# Patient Record
Sex: Male | Born: 1975 | Race: White | Hispanic: No | Marital: Married | Smoking: Never smoker
Health system: Southern US, Community
[De-identification: ages and names within clinical notes are randomized; demographics above are authoritative.]

## PROBLEM LIST (undated history)

## (undated) DIAGNOSIS — T7840XA Allergy, unspecified, initial encounter: Secondary | ICD-10-CM

## (undated) HISTORY — PX: INGUINAL HERNIA REPAIR: SUR1180

## (undated) HISTORY — PX: ADENOIDECTOMY: SUR15

## (undated) HISTORY — DX: Allergy, unspecified, initial encounter: T78.40XA

---

## 1999-01-12 ENCOUNTER — Encounter: Payer: Self-pay | Admitting: Emergency Medicine

## 1999-01-12 ENCOUNTER — Emergency Department (HOSPITAL_COMMUNITY): Admission: EM | Admit: 1999-01-12 | Discharge: 1999-01-12 | Payer: Self-pay | Admitting: Emergency Medicine

## 2006-01-20 ENCOUNTER — Other Ambulatory Visit: Payer: Self-pay

## 2006-01-20 ENCOUNTER — Emergency Department: Payer: Self-pay | Admitting: Emergency Medicine

## 2007-12-24 IMAGING — CR DG CHEST 2V
1 series · 2 of 2 positions shown · non-contrast
Comparison: none

REASON FOR EXAM: Chest pain. Rm 14
COMMENTS:

PROCEDURE:     DXR - DXR CHEST PA (OR AP) AND LATERAL  - January 20, 2006 [DATE]
RESULT:     The lung fields are clear.  No pneumonia, pneumothorax, or
pleural effusion is seen.  The heart, mediastinal, and osseous structures
show no significant abnormalities.

[Series 1: view not recorded · 0.17mm/px · 2 of 2 slices shown]
[im 1/2]
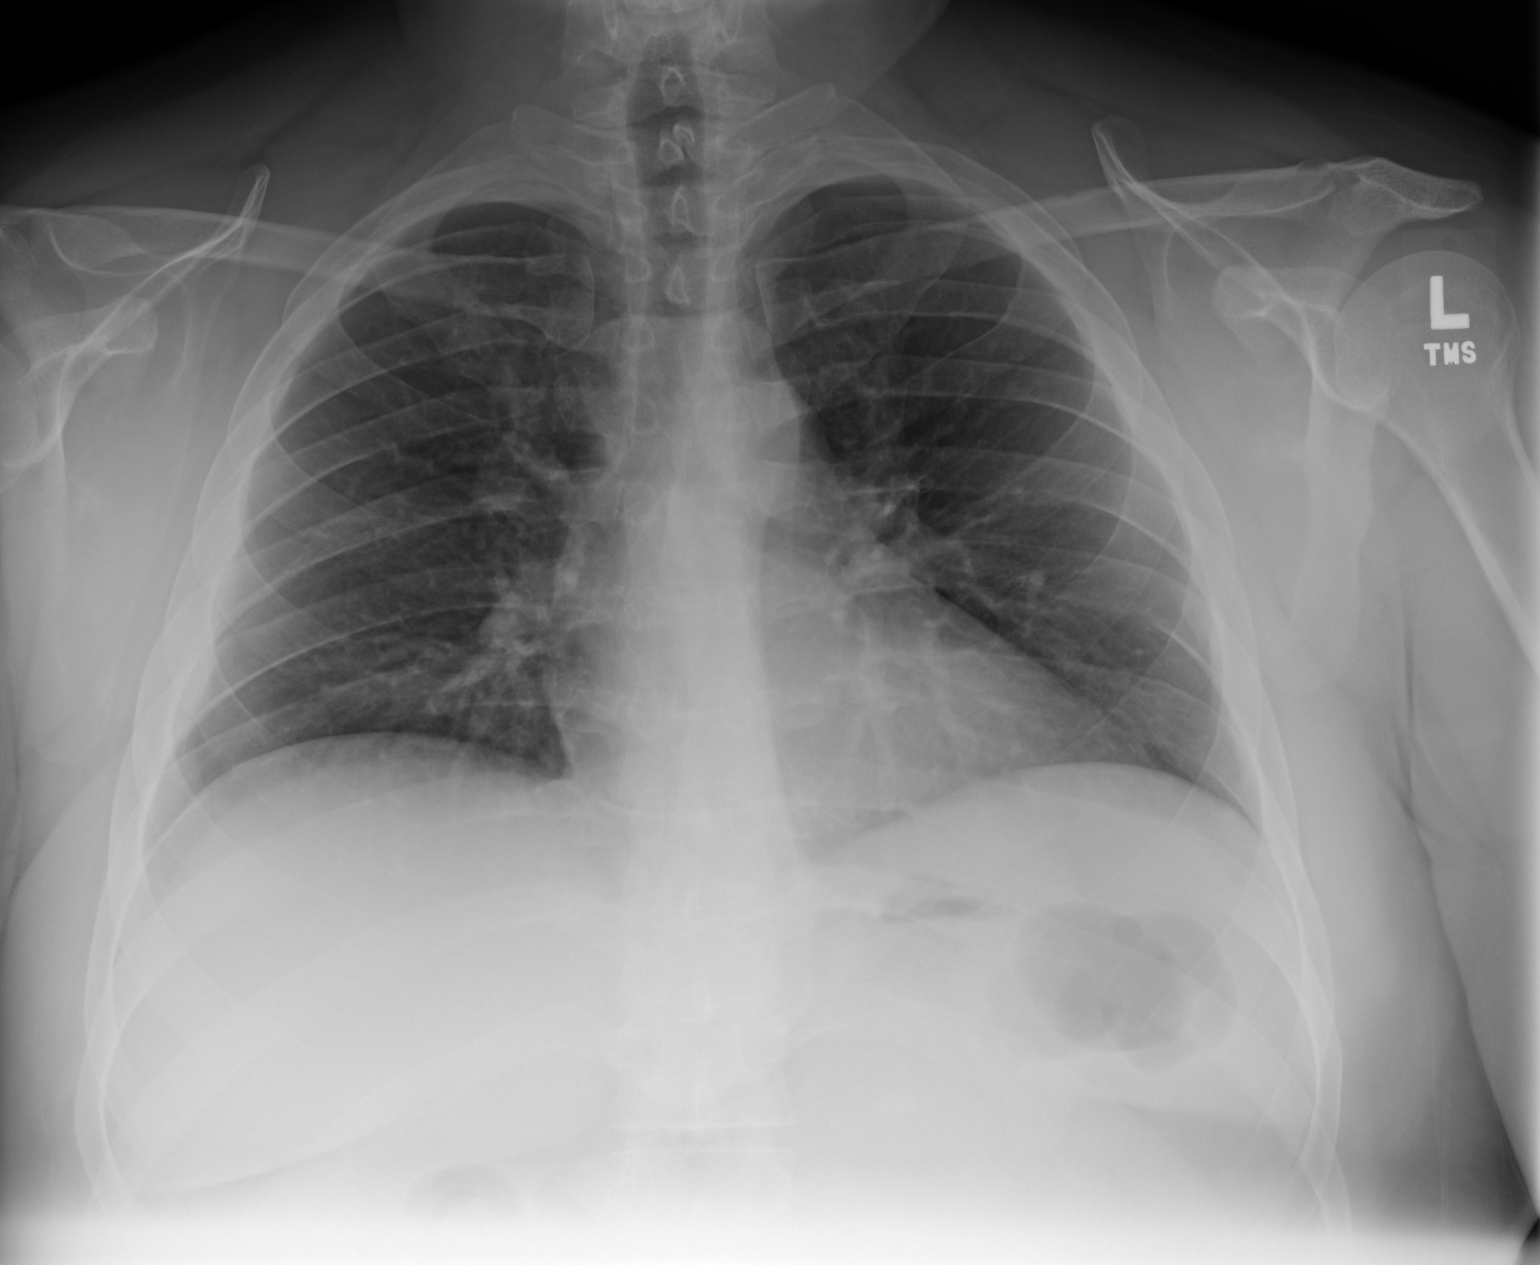
[im 2/2]
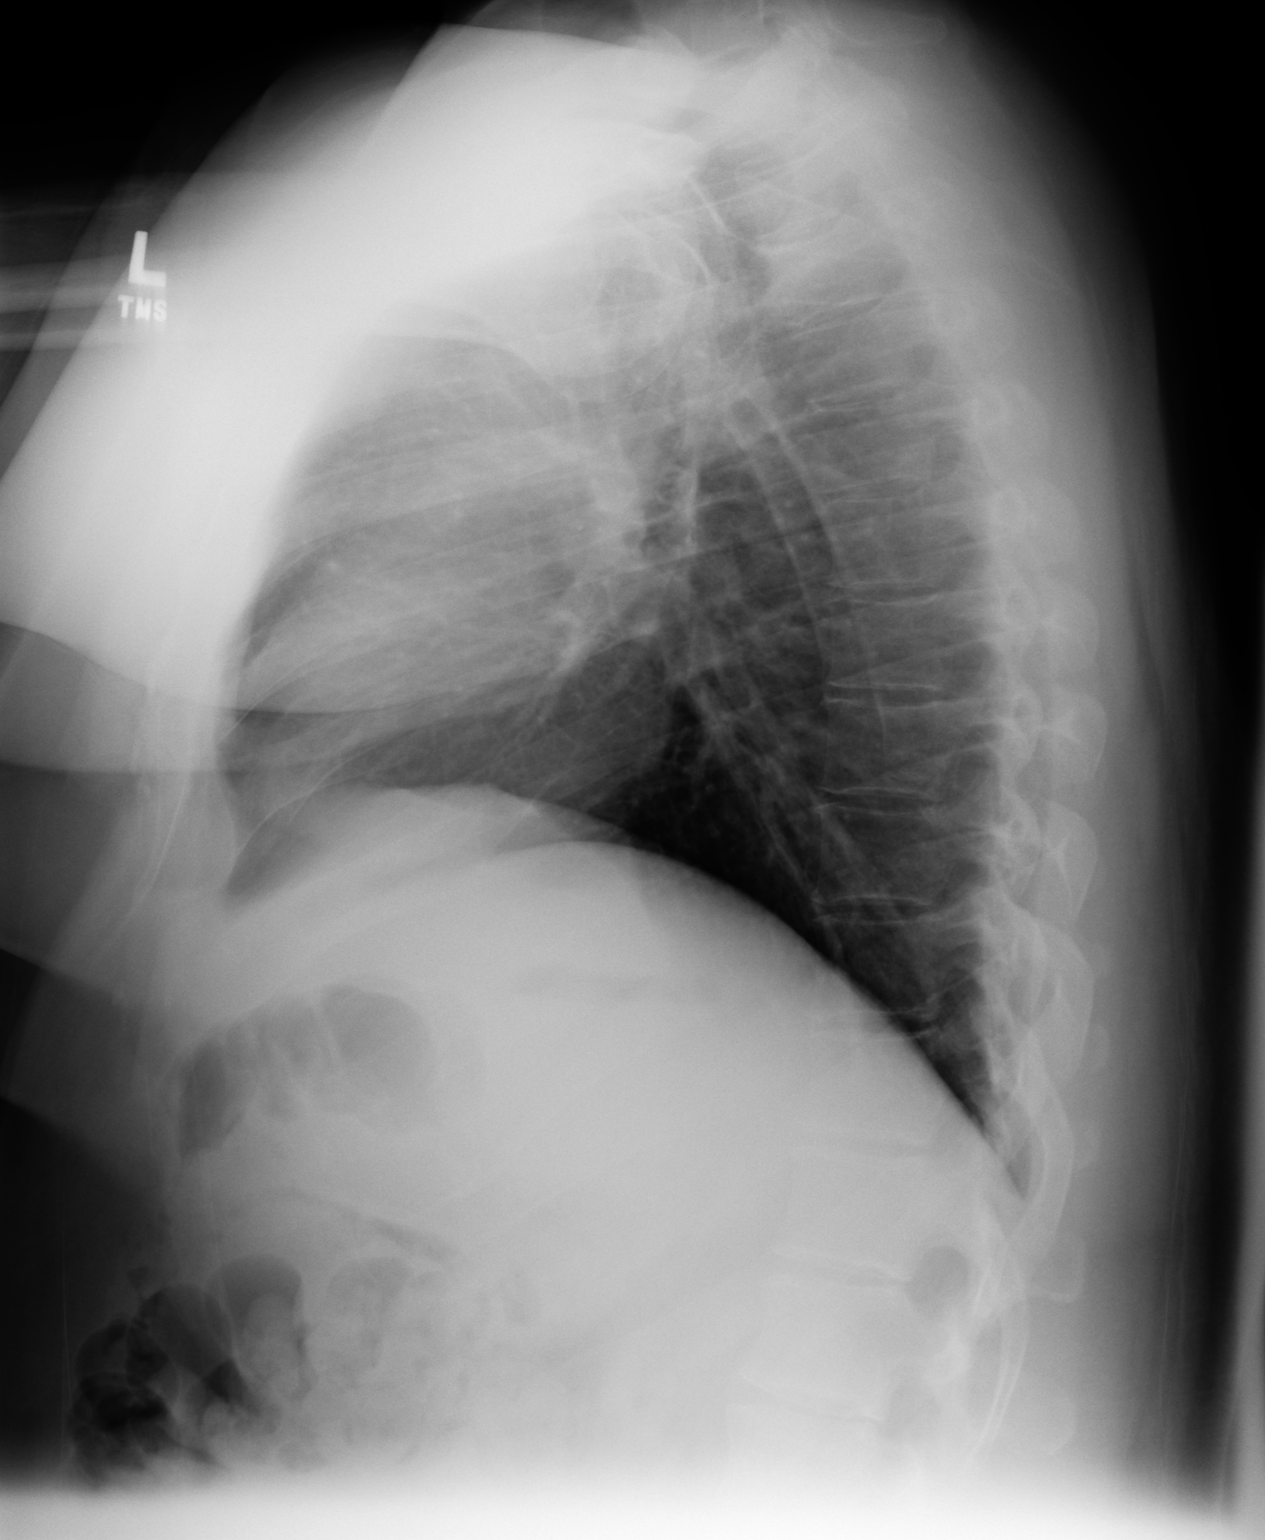

[2 of 2 positions shown; findings below may reference images not displayed]

IMPRESSION: No acute changes are identified.

## 2008-03-05 ENCOUNTER — Ambulatory Visit: Payer: Self-pay | Admitting: Family Medicine

## 2008-03-05 DIAGNOSIS — E663 Overweight: Secondary | ICD-10-CM | POA: Insufficient documentation

## 2008-03-05 DIAGNOSIS — J309 Allergic rhinitis, unspecified: Secondary | ICD-10-CM | POA: Insufficient documentation

## 2008-03-05 DIAGNOSIS — Z6837 Body mass index (BMI) 37.0-37.9, adult: Secondary | ICD-10-CM

## 2008-03-05 DIAGNOSIS — F339 Major depressive disorder, recurrent, unspecified: Secondary | ICD-10-CM | POA: Insufficient documentation

## 2008-03-05 DIAGNOSIS — E661 Drug-induced obesity: Secondary | ICD-10-CM | POA: Insufficient documentation

## 2008-05-05 ENCOUNTER — Ambulatory Visit: Payer: Self-pay | Admitting: Family Medicine

## 2008-05-05 DIAGNOSIS — R071 Chest pain on breathing: Secondary | ICD-10-CM | POA: Insufficient documentation

## 2008-07-28 ENCOUNTER — Ambulatory Visit: Payer: Self-pay | Admitting: Family Medicine

## 2008-07-28 DIAGNOSIS — E119 Type 2 diabetes mellitus without complications: Secondary | ICD-10-CM | POA: Insufficient documentation

## 2008-07-28 DIAGNOSIS — R030 Elevated blood-pressure reading, without diagnosis of hypertension: Secondary | ICD-10-CM | POA: Insufficient documentation

## 2008-09-27 ENCOUNTER — Telehealth: Payer: Self-pay | Admitting: Family Medicine

## 2009-04-04 ENCOUNTER — Telehealth: Payer: Self-pay | Admitting: Family Medicine

## 2009-11-24 ENCOUNTER — Encounter: Payer: Self-pay | Admitting: Family Medicine

## 2010-07-04 NOTE — Miscellaneous (Signed)
  Clinical Lists Changes  Medications: Added new medication of PRILOSEC 20 MG CPDR (OMEPRAZOLE) one tablet daily - Signed Rx of PRILOSEC 20 MG CPDR (OMEPRAZOLE) one tablet daily;  #30 x 11;  Signed;  Entered by: Benny Lennert CMA (AAMA);  Authorized by: Hannah Beat MD;  Method used: Electronically to [facility]  [place], [place], [place]  [address], Ph: [phone] or [phone], Fax: [phone]    Prescriptions: PRILOSEC 20 MG CPDR (OMEPRAZOLE) one tablet daily  #30 x 11   Entered by:   Benny Lennert CMA (AAMA)   Authorized by:   Hannah Beat MD   Signed by:   Benny Lennert CMA (AAMA) on 11/24/2009   Method used:   Electronically to        [facility]  [place] (retail)       [address]       [place], [place]  [address]       Ph: [phone] or [phone]       Fax: [phone]   RxID:   [id number]

## 2010-11-14 ENCOUNTER — Emergency Department (HOSPITAL_COMMUNITY): Admission: EM | Admit: 2010-11-14 | Payer: Self-pay | Source: Home / Self Care

## 2010-12-18 ENCOUNTER — Encounter: Payer: Self-pay | Admitting: Family Medicine

## 2010-12-19 ENCOUNTER — Ambulatory Visit (INDEPENDENT_AMBULATORY_CARE_PROVIDER_SITE_OTHER): Payer: 59 | Admitting: Family Medicine

## 2010-12-19 ENCOUNTER — Encounter: Payer: Self-pay | Admitting: Family Medicine

## 2010-12-19 VITALS — BP 104/70 | HR 77 | Temp 97.8°F | Ht 66.0 in | Wt 211.8 lb

## 2010-12-19 DIAGNOSIS — IMO0002 Reserved for concepts with insufficient information to code with codable children: Secondary | ICD-10-CM

## 2010-12-19 DIAGNOSIS — F329 Major depressive disorder, single episode, unspecified: Secondary | ICD-10-CM

## 2010-12-19 DIAGNOSIS — L723 Sebaceous cyst: Secondary | ICD-10-CM

## 2010-12-19 MED ORDER — SERTRALINE HCL 100 MG PO TABS: 100.0000 mg | ORAL_TABLET | Freq: Every day | ORAL | Status: DC

## 2010-12-19 NOTE — Assessment & Plan Note (Signed)
Most likely cyst, less likely lipoma given location. No infection, mobile, no red flags.  Pt wishes to have this removed for cosmetic reasons. Referral to Derm given location.

## 2010-12-19 NOTE — Patient Instructions (Signed)
Stop at front desk to speak with Shirlee Limerick to set up referral.

## 2010-12-19 NOTE — Progress Notes (Signed)
  Subjective:    Patient ID: William Garza, male    DOB: 1976/02/26, 35 y.o.   MRN: [id number]  HPI  35 year old male with mass on left forehead, present for years. Increasing in size over last 6 months.  Non tender, no discharge, no redness. No fever, no headhaches, no neuro symptoms.  Depression, well controlled on sertraline. Has been stable on this med for a long time.  No SI, no insomnia.   Review of Systems  Constitutional: Negative for fever and fatigue.  HENT: Negative for ear pain.   Eyes: Negative for pain.  Respiratory: Negative for cough and shortness of breath.   Cardiovascular: Negative for chest pain, palpitations and leg swelling.  Gastrointestinal: Negative for diarrhea, constipation and abdominal distention.  Genitourinary: Negative for discharge, penile swelling, scrotal swelling, penile pain and testicular pain.       Objective:   Physical Exam  Constitutional: Vital signs are normal. He appears well-developed and well-nourished.  HENT:  Head: Normocephalic.  Right Ear: Hearing normal.  Left Ear: Hearing normal.  Nose: Nose normal.  Mouth/Throat: Oropharynx is clear and moist and mucous membranes are normal.  Neck: Trachea normal. Carotid bruit is not present. No mass and no thyromegaly present.  Cardiovascular: Normal rate, regular rhythm and normal pulses.  Exam reveals no gallop, no distant heart sounds and no friction rub.   No murmur heard.      No peripheral edema  Pulmonary/Chest: Effort normal and breath sounds normal. No respiratory distress.  Skin: Skin is warm, dry and intact. Lesion noted. No rash noted.          Left forehead, 1 inch diameter mobile, soft mass  No erythema, no heat, non tender  Psychiatric: He has a normal mood and affect. His speech is normal and behavior is normal. Judgment and thought content normal. His mood appears not anxious. Thought content is not delusional. Cognition and memory are normal. He does not exhibit a  depressed mood. He expresses no homicidal and no suicidal ideation. He expresses no suicidal plans and no homicidal plans.          Assessment & Plan:

## 2010-12-19 NOTE — Assessment & Plan Note (Signed)
Well controlled on current dose of sertraline. NO SI, NO HI.

## 2012-01-14 ENCOUNTER — Other Ambulatory Visit: Payer: Self-pay | Admitting: Family Medicine

## 2012-01-14 ENCOUNTER — Other Ambulatory Visit: Payer: Self-pay | Admitting: *Deleted

## 2012-01-14 MED ORDER — SERTRALINE HCL 100 MG PO TABS: 100.0000 mg | ORAL_TABLET | Freq: Every day | ORAL | Status: DC

## 2012-02-08 ENCOUNTER — Ambulatory Visit (INDEPENDENT_AMBULATORY_CARE_PROVIDER_SITE_OTHER): Payer: 59 | Admitting: Family Medicine

## 2012-02-08 ENCOUNTER — Encounter: Payer: Self-pay | Admitting: Family Medicine

## 2012-02-08 VITALS — BP 122/84 | HR 98 | Resp 16 | Ht 65.5 in | Wt 231.0 lb

## 2012-02-08 DIAGNOSIS — Z9884 Bariatric surgery status: Secondary | ICD-10-CM | POA: Insufficient documentation

## 2012-02-08 DIAGNOSIS — H109 Unspecified conjunctivitis: Secondary | ICD-10-CM | POA: Insufficient documentation

## 2012-02-08 DIAGNOSIS — Z0000 Encounter for general adult medical examination without abnormal findings: Secondary | ICD-10-CM

## 2012-02-08 DIAGNOSIS — F329 Major depressive disorder, single episode, unspecified: Secondary | ICD-10-CM

## 2012-02-08 DIAGNOSIS — B356 Tinea cruris: Secondary | ICD-10-CM | POA: Insufficient documentation

## 2012-02-08 DIAGNOSIS — R7309 Other abnormal glucose: Secondary | ICD-10-CM

## 2012-02-08 MED ORDER — OFLOXACIN 0.3 % OP SOLN: 1.0000 [drp] | Freq: Four times a day (QID) | OPHTHALMIC | Status: AC

## 2012-02-08 MED ORDER — SERTRALINE HCL 100 MG PO TABS: 100.0000 mg | ORAL_TABLET | Freq: Every day | ORAL | Status: DC

## 2012-02-08 MED ORDER — KETOCONAZOLE 2 % EX CREA: TOPICAL_CREAM | Freq: Every day | CUTANEOUS | Status: AC

## 2012-02-08 NOTE — Progress Notes (Signed)
Subjective:    Patient ID: William Garza, male    DOB: 1975-07-26, 36 y.o.   MRN: [id number]  HPI  The patient is here for annual wellness exam and preventative care.    He has been doing wel overall.  In last 2-3 days he has had some redness in left eye. This AM had right eye matted shut.  sensitive to light. Slightly blurred vision in rifht eye. Not itchy. Usually wears contacts.  No allergy symptoms.  No fever.   Depression: Well controlled on sertraline.  GERD:On prilosec 20 mg daily, no reflux symptoms.  Exercise: bicycling Diet: Good, fruit and veggies, water... Has started back on soda in last year though. Has gained 20 lbs in last year. Wt Readings from Last 3 Encounters:  02/08/12 231 lb (104.781 kg)  12/19/10 211 lb 12.8 oz (96.072 kg)  07/28/08 290 lb 4 oz (131.657 kg)        Review of Systems  Constitutional: Negative for fever, fatigue and unexpected weight change.  HENT: Negative for ear pain, congestion, sore throat, rhinorrhea, trouble swallowing and postnasal drip.   Eyes: Negative for pain.  Respiratory: Negative for cough, shortness of breath and wheezing.   Cardiovascular: Negative for chest pain, palpitations and leg swelling.  Gastrointestinal: Negative for nausea, abdominal pain, diarrhea, constipation and blood in stool.  Genitourinary: Negative for dysuria, urgency, hematuria, discharge, penile swelling, scrotal swelling, difficulty urinating, penile pain and testicular pain.  Skin: Negative for rash.  Neurological: Negative for syncope, weakness, light-headedness, numbness and headaches.  Psychiatric/Behavioral: Negative for behavioral problems and dysphoric mood. The patient is not nervous/anxious.        Objective:   Physical Exam  Constitutional: He appears well-developed and well-nourished.  Non-toxic appearance. He does not appear ill. No distress.  HENT:  Head: Normocephalic and atraumatic.  Right Ear: Hearing, tympanic membrane,  external ear and ear canal normal.  Left Ear: Hearing, tympanic membrane, external ear and ear canal normal.  Nose: Nose normal.  Mouth/Throat: Uvula is midline, oropharynx is clear and moist and mucous membranes are normal.  Eyes: EOM and lids are normal. Pupils are equal, round, and reactive to light. No foreign bodies found. Right eye exhibits no exudate. Left eye exhibits no exudate. Right conjunctiva is injected. Left conjunctiva is not injected. Right eye exhibits normal extraocular motion. Left eye exhibits normal extraocular motion.  Neck: Trachea normal, normal range of motion and phonation normal. Neck supple. Carotid bruit is not present. No mass and no thyromegaly present.  Cardiovascular: Normal rate, regular rhythm, S1 normal, S2 normal, intact distal pulses and normal pulses.  Exam reveals no gallop.   No murmur heard. Pulmonary/Chest: Breath sounds normal. He has no wheezes. He has no rhonchi. He has no rales.  Abdominal: Soft. Normal appearance and bowel sounds are normal. There is no hepatosplenomegaly. There is no tenderness. There is no rebound, no guarding and no CVA tenderness. No hernia. Hernia confirmed negative in the right inguinal area and confirmed negative in the left inguinal area.  Genitourinary: Prostate normal, testes normal and penis normal. Rectal exam shows no external hemorrhoid, no internal hemorrhoid, no fissure, no mass, no tenderness and anal tone normal. Guaiac negative stool. Prostate is not enlarged and not tender. Right testis shows no mass and no tenderness. Left testis shows no mass and no tenderness. No paraphimosis or penile tenderness.  Lymphadenopathy:    He has no cervical adenopathy.       Right: No inguinal adenopathy present.  Left: No inguinal adenopathy present.  Neurological: He is alert. He has normal strength and normal reflexes. No cranial nerve deficit or sensory deficit. Gait normal.  Skin: Skin is warm, dry and intact. No rash  noted.       Erythema and burning in groin B under pannus  Psychiatric: He has a normal mood and affect. His speech is normal and behavior is normal. Judgment normal.          Assessment & Plan:  The patient's preventative maintenance and recommended screening tests for an annual wellness exam were reviewed in full today. Brought up to date unless services declined.  Counselled on the importance of diet, exercise, and its role in overall health and mortality. The patient's FH and SH was reviewed, including their home life, tobacco status, and drug and alcohol status.   Vaccines: Uptodate, due next year Non smoker

## 2012-02-08 NOTE — Assessment & Plan Note (Signed)
Treat with ketoconazole cream

## 2012-02-08 NOTE — Assessment & Plan Note (Signed)
Significant light sensitivity and tender to palpate. No sign of keratitis seen. Vision intact.  Will treat with antibiotic drops. If not improving in 24 hours refer to opthamologist for full eye eval.

## 2012-02-08 NOTE — Patient Instructions (Addendum)
Stopping soda. Work on increasing exercise. Return for fasting labs. If right eye redness not improving in next 24 hours with eye drops and time.. See eye doctor for full eye exam. Throw away contacts and leave out until red eye is improved.  Cream for fungal infeciton in groin. Apply for 48 hours after symtpoms resolved.

## 2012-02-11 ENCOUNTER — Other Ambulatory Visit (INDEPENDENT_AMBULATORY_CARE_PROVIDER_SITE_OTHER): Payer: 59

## 2012-02-11 DIAGNOSIS — R7309 Other abnormal glucose: Secondary | ICD-10-CM

## 2012-02-11 DIAGNOSIS — Z9884 Bariatric surgery status: Secondary | ICD-10-CM

## 2012-02-11 LAB — LIPID PANEL: Cholesterol: 172 mg/dL (ref 0–200)

## 2012-02-11 LAB — COMPREHENSIVE METABOLIC PANEL
Albumin: 3.9 g/dL (ref 3.5–5.2)
CO2: 27 mEq/L (ref 19–32)
Calcium: 9.4 mg/dL (ref 8.4–10.5)
Chloride: 104 mEq/L (ref 96–112)
GFR: 93.97 mL/min (ref >60.00)
Glucose, Bld: 111 mg/dL — ABNORMAL HIGH (ref 70–99)
Potassium: 4.2 mEq/L (ref 3.5–5.1)
Sodium: 141 mEq/L (ref 135–145)
Total Bilirubin: 0.3 mg/dL (ref 0.3–1.2)
Total Protein: 7.3 g/dL (ref 6.0–8.3)

## 2012-02-11 LAB — VITAMIN B12: Vitamin B-12: 166 pg/mL — ABNORMAL LOW (ref 211–911)

## 2012-02-12 LAB — VITAMIN D 25 HYDROXY (VIT D DEFICIENCY, FRACTURES): Vit D, 25-Hydroxy: 52 ng/mL (ref 30–89)

## 2013-02-24 ENCOUNTER — Other Ambulatory Visit: Payer: Self-pay | Admitting: Family Medicine

## 2013-02-24 NOTE — Telephone Encounter (Signed)
Needs appt prior to additional refills. Okay to fill until appt. Made.

## 2013-02-24 NOTE — Telephone Encounter (Signed)
Last office visit 02/08/2012.  Ok to refill?

## 2013-02-24 NOTE — Telephone Encounter (Signed)
See below

## 2013-02-24 NOTE — Telephone Encounter (Signed)
Patient is scheduled for CPX on 02/26/2013.  States he has enough medication to get him to this appointment.

## 2013-02-27 ENCOUNTER — Ambulatory Visit (INDEPENDENT_AMBULATORY_CARE_PROVIDER_SITE_OTHER): Payer: 59 | Admitting: Family Medicine

## 2013-02-27 ENCOUNTER — Encounter: Payer: Self-pay | Admitting: Family Medicine

## 2013-02-27 VITALS — BP 106/70 | HR 74 | Temp 98.2°F | Ht 66.0 in | Wt 233.8 lb

## 2013-02-27 DIAGNOSIS — Z0000 Encounter for general adult medical examination without abnormal findings: Secondary | ICD-10-CM

## 2013-02-27 DIAGNOSIS — R7309 Other abnormal glucose: Secondary | ICD-10-CM

## 2013-02-27 DIAGNOSIS — F329 Major depressive disorder, single episode, unspecified: Secondary | ICD-10-CM

## 2013-02-27 LAB — LIPID PANEL
Total CHOL/HDL Ratio: 4
VLDL: 12 mg/dL (ref 0.0–40.0)

## 2013-02-27 LAB — COMPREHENSIVE METABOLIC PANEL
ALT: 15 U/L (ref 0–53)
AST: 15 U/L (ref 0–37)
CO2: 29 mEq/L (ref 19–32)
Calcium: 9.1 mg/dL (ref 8.4–10.5)
Chloride: 107 mEq/L (ref 96–112)
GFR: 98.22 mL/min (ref >60.00)
Potassium: 4 mEq/L (ref 3.5–5.1)
Sodium: 141 mEq/L (ref 135–145)
Total Protein: 7 g/dL (ref 6.0–8.3)

## 2013-02-27 LAB — HEMOGLOBIN A1C: Hgb A1c MFr Bld: 6.5 % (ref 4.6–6.5)

## 2013-02-27 MED ORDER — SERTRALINE HCL 100 MG PO TABS: 100.0000 mg | ORAL_TABLET | Freq: Every day | ORAL | Status: DC

## 2013-02-27 NOTE — Progress Notes (Signed)
HPI  The patient is here for annual wellness exam and preventative care.  He has been doing well overall.   Depression: Well controlled on sertraline.  No SI, no HI.  Prediabetes: glucose elevated last year.. Will recheck.  GERD:On prilosec 20 mg daily, no reflux symptoms.   Exercise: bicycling, some swimming, walking 3/4 mile at lunch break. Diet: Moderate, fruit and veggies, water. He does have trouble with eating regularly with his job at a [employer]. He has been unable to lose weight as he would have liked.  Wt Readings from Last 3 Encounters:  02/27/13 233 lb 12 oz (106.028 kg)  02/08/12 231 lb (104.781 kg)  12/19/10 211 lb 12.8 oz (96.072 kg)   Review of Systems  Constitutional: Negative for fever, fatigue and unexpected weight change.  HENT: Negative for ear pain, congestion, sore throat, rhinorrhea, trouble swallowing and postnasal drip.  Eyes: Negative for pain.  Respiratory: Negative for cough, shortness of breath and wheezing.  Cardiovascular: Negative for chest pain, palpitations and leg swelling.  Gastrointestinal: Negative for nausea, abdominal pain, diarrhea, constipation and blood in stool.  Genitourinary: Negative for dysuria, urgency, hematuria, discharge, penile swelling, scrotal swelling, difficulty urinating, penile pain and testicular pain.  Skin: Negative for rash.  Neurological: Negative for syncope, weakness, light-headedness, numbness and headaches.  Psychiatric/Behavioral: Negative for behavioral problems and dysphoric mood. The patient is not nervous/anxious.  Objective:   Physical Exam  Constitutional: He appears well-developed and well-nourished. Non-toxic appearance. He does not appear ill. No distress.  HENT:  Head: Normocephalic and atraumatic.  Right Ear: Hearing, tympanic membrane, external ear and ear canal normal.  Left Ear: Hearing, tympanic membrane, external ear and ear canal normal.  Nose: Nose normal.  Mouth/Throat: Uvula is midline,  oropharynx is clear and moist and mucous membranes are normal.  Eyes: EOM and lids are normal. Pupils are equal, round, and reactive to light. No foreign bodies found. Right eye exhibits no exudate. Left eye exhibits no exudate. Right conjunctiva is not injected. Left conjunctiva is not injected. Right eye exhibits normal extraocular motion. Left eye exhibits normal extraocular motion.  Neck: Trachea normal, normal range of motion and phonation normal. Neck supple. Carotid bruit is not present. No mass and no thyromegaly present.  Cardiovascular: Normal rate, regular rhythm, S1 normal, S2 normal, intact distal pulses and normal pulses. Exam reveals no gallop.  No murmur heard.  Pulmonary/Chest: Breath sounds normal. He has no wheezes. He has no rhonchi. He has no rales.  Abdominal: Soft. Normal appearance and bowel sounds are normal. There is no hepatosplenomegaly. There is no tenderness. There is no rebound, no guarding and no CVA tenderness. No hernia. Hernia confirmed negative in the right inguinal area and confirmed negative in the left inguinal area.  Genitourinary: Prostate normal, testes normal and penis normal. Rectal exam shows no external hemorrhoid, no internal hemorrhoid, no fissure, no mass, no tenderness and anal tone normal. Guaiac negative stool. Prostate is not enlarged and not tender. Right testis shows no mass and no tenderness. Left testis shows no mass and no tenderness. No paraphimosis or penile tenderness.  Lymphadenopathy:  He has no cervical adenopathy.  Right: No inguinal adenopathy present.  Left: No inguinal adenopathy present.  Neurological: He is alert. He has normal strength and normal reflexes. No cranial nerve deficit or sensory deficit. Gait normal.  Skin: Skin is warm, dry and intact. No rash noted.  Psychiatric: He has a normal mood and affect. His speech is normal and behavior is  normal. Judgment normal.  Assessment & Plan:   The patient's preventative maintenance  and recommended screening tests for an annual wellness exam were reviewed in full today.  Brought up to date unless services declined.  Counselled on the importance of diet, exercise, and its role in overall health and mortality.  The patient's FH and SH was reviewed, including their home life, tobacco status, and drug and alcohol status.   Vaccines: Uptodate, due for flu and tdap.Marland Kitchen He will look into this at work. Non smoker. No family hx early colon cancer or prostate cancer.

## 2013-02-27 NOTE — Patient Instructions (Addendum)
Stop at lab on your way out. Work on [employer], regular cardiovascular exercise and weight loss.

## 2013-03-02 ENCOUNTER — Encounter: Payer: Self-pay | Admitting: *Deleted

## 2013-04-09 ENCOUNTER — Other Ambulatory Visit: Payer: Self-pay

## 2013-12-28 ENCOUNTER — Ambulatory Visit (INDEPENDENT_AMBULATORY_CARE_PROVIDER_SITE_OTHER): Payer: 59 | Admitting: Family Medicine

## 2013-12-28 ENCOUNTER — Encounter: Payer: Self-pay | Admitting: Family Medicine

## 2013-12-28 VITALS — BP 124/82 | HR 79 | Temp 98.3°F | Ht 66.0 in | Wt 240.2 lb

## 2013-12-28 DIAGNOSIS — L03319 Cellulitis of trunk, unspecified: Principal | ICD-10-CM

## 2013-12-28 DIAGNOSIS — L02219 Cutaneous abscess of trunk, unspecified: Secondary | ICD-10-CM

## 2013-12-28 MED ORDER — SULFAMETHOXAZOLE-TMP DS 800-160 MG PO TABS: 2.0000 | ORAL_TABLET | Freq: Two times a day (BID) | ORAL | Status: DC

## 2013-12-28 NOTE — Progress Notes (Signed)
   [address] [place] [address] Phone: [phone] Fax: [phone]  Patient ID: William SoxJeffery Garza MRN: [id number], DOB: 09/23/1975, 38 y.o. Date of Encounter: 12/28/2013  Primary Physician:  Kerby NoraAmy Bedsole, MD   Chief Complaint: Staph Infection   Subjective:   History of Present Illness:  William Garza is a 38 y.o. very pleasant male patient who presents with the following:  R side - noticed on Friday. Flatter since then. No pain. Drainage on Saturday. No real pain, after drainage it has seemed to improve.   Past Medical History, Surgical History, Social History, Family History, Problem List, Medications, and Allergies have been reviewed and updated if relevant.  Review of Systems:  GEN: No acute illnesses, no fevers, chills. GI: No n/v/d, eating normally Pulm: No SOB Interactive and getting along well at home.  Otherwise, ROS is as per the HPI.  Objective:   Physical Examination: BP 124/82  Pulse 79  Temp(Src) 98.3 F (36.8 C) (Oral)  Ht 5\' 6"  (1.676 m)  Wt 240 lb 4 oz (108.977 kg)  BMI 38.80 kg/m2   GEN: WDWN, NAD, Non-toxic, Alert & Oriented x 3 HEENT: Atraumatic, Normocephalic.  Ears and Nose: No external deformity. EXTR: No clubbing/cyanosis/edema NEURO: Normal gait.  PSYCH: Normally interactive. Conversant. Not depressed or anxious appearing.  Calm demeanor.   Skin: area of drainage noted without significant induration, no fluctuance   Laboratory and Imaging Data:  Assessment & Plan:   Cellulitis and abscess of trunk  I think self drained. No fluctuance now and improving. MRSA coverage.   Wife is an ICU nurse and will check daily  New Prescriptions   SULFAMETHOXAZOLE-TRIMETHOPRIM (BACTRIM DS) 800-160 MG PER TABLET    Take 2 tablets by mouth 2 (two) times daily.   Modified Medications   No medications on file   No orders of the defined types were placed in this encounter.   Follow-up: No Follow-up on file. Unless noted above, the  patient is to follow-up if symptoms worsen. Red flags were reviewed with the patient.  Signed,  Elpidio GaleaSpencer T. Pearlean Sabina, MD, CAQ Sports Medicine   Discontinued Medications   No medications on file   Current Medications at Discharge:   Medication List       This list is accurate as of: 12/28/13 10:19 AM.  Always use your most recent med list.               omeprazole 20 MG capsule  Commonly known as:  PRILOSEC  Take 20 mg by mouth daily.     sertraline 100 MG tablet  Commonly known as:  ZOLOFT  Take 1 tablet (100 mg total) by mouth daily.     sulfamethoxazole-trimethoprim 800-160 MG per tablet  Commonly known as:  BACTRIM DS  Take 2 tablets by mouth 2 (two) times daily.

## 2013-12-28 NOTE — Progress Notes (Signed)
Pre visit review using our clinic review tool, if applicable. No additional management support is needed unless otherwise documented below in the visit note. 

## 2014-03-19 ENCOUNTER — Other Ambulatory Visit: Payer: Self-pay | Admitting: Family Medicine

## 2014-03-19 ENCOUNTER — Other Ambulatory Visit: Payer: Self-pay

## 2014-04-15 ENCOUNTER — Other Ambulatory Visit: Payer: Self-pay | Admitting: Family Medicine

## 2014-05-17 ENCOUNTER — Other Ambulatory Visit (INDEPENDENT_AMBULATORY_CARE_PROVIDER_SITE_OTHER): Payer: 59

## 2014-05-17 ENCOUNTER — Telehealth: Payer: Self-pay | Admitting: Family Medicine

## 2014-05-17 DIAGNOSIS — R7303 Prediabetes: Secondary | ICD-10-CM

## 2014-05-17 DIAGNOSIS — R7309 Other abnormal glucose: Secondary | ICD-10-CM

## 2014-05-17 LAB — COMPREHENSIVE METABOLIC PANEL
ALK PHOS: 104 U/L (ref 39–117)
ALT: 19 U/L (ref 0–53)
AST: 16 U/L (ref 0–37)
Albumin: 3.6 g/dL (ref 3.5–5.2)
BILIRUBIN TOTAL: 0.4 mg/dL (ref 0.2–1.2)
BUN: 13 mg/dL (ref 6–23)
CO2: 29 mEq/L (ref 19–32)
Calcium: 9 mg/dL (ref 8.4–10.5)
Chloride: 105 mEq/L (ref 96–112)
Creatinine, Ser: 0.9 mg/dL (ref 0.4–1.5)
GFR: 100.15 mL/min (ref >60.00)
GLUCOSE: 104 mg/dL — AB (ref 70–99)
Potassium: 3.5 mEq/L (ref 3.5–5.1)
SODIUM: 138 meq/L (ref 135–145)
Total Protein: 6.5 g/dL (ref 6.0–8.3)

## 2014-05-17 LAB — LIPID PANEL
CHOLESTEROL: 147 mg/dL (ref 0–200)
HDL: 32 mg/dL — AB (ref >39.00)
LDL Cholesterol: 104 mg/dL — ABNORMAL HIGH (ref 0–99)
NonHDL: 115
TRIGLYCERIDES: 54 mg/dL (ref 0.0–149.0)
Total CHOL/HDL Ratio: 5
VLDL: 10.8 mg/dL (ref 0.0–40.0)

## 2014-05-17 LAB — HEMOGLOBIN A1C: HEMOGLOBIN A1C: 6.5 % (ref 4.6–6.5)

## 2014-05-17 NOTE — Telephone Encounter (Signed)
-----   Message from Alvina Chouerri J Walsh sent at 05/12/2014  4:55 PM EST ----- Regarding: Lab orders for Monday,12.14.15 Patient is scheduled for CPX labs, please order future labs, Thanks , Camelia Engerri

## 2014-05-20 ENCOUNTER — Ambulatory Visit (INDEPENDENT_AMBULATORY_CARE_PROVIDER_SITE_OTHER): Payer: 59 | Admitting: Family Medicine

## 2014-05-20 ENCOUNTER — Encounter: Payer: Self-pay | Admitting: Family Medicine

## 2014-05-20 VITALS — BP 104/70 | HR 65 | Temp 98.5°F | Ht 66.0 in | Wt 240.8 lb

## 2014-05-20 DIAGNOSIS — F3340 Major depressive disorder, recurrent, in remission, unspecified: Secondary | ICD-10-CM

## 2014-05-20 DIAGNOSIS — E8881 Metabolic syndrome: Secondary | ICD-10-CM

## 2014-05-20 DIAGNOSIS — Z0000 Encounter for general adult medical examination without abnormal findings: Secondary | ICD-10-CM

## 2014-05-20 DIAGNOSIS — R7309 Other abnormal glucose: Secondary | ICD-10-CM

## 2014-05-20 DIAGNOSIS — R202 Paresthesia of skin: Secondary | ICD-10-CM

## 2014-05-20 DIAGNOSIS — R7303 Prediabetes: Secondary | ICD-10-CM

## 2014-05-20 DIAGNOSIS — R200 Anesthesia of skin: Secondary | ICD-10-CM | POA: Insufficient documentation

## 2014-05-20 LAB — VITAMIN B12: Vitamin B-12: 109 pg/mL — ABNORMAL LOW (ref 211–911)

## 2014-05-20 LAB — TSH: TSH: 1.37 u[IU]/mL (ref 0.35–4.50)

## 2014-05-20 MED ORDER — SERTRALINE HCL 100 MG PO TABS: ORAL_TABLET | ORAL | Status: DC

## 2014-05-20 NOTE — Progress Notes (Signed)
The patient is here for annual wellness exam and preventative care.  He has been doing well overall.   At night in bed or if standing for a longtime , has noted burning in right upper lateral thigh, no severe, just annoying. Improves with changing positions.  No cramps. No radiation , no low back pain.  Depression: Well controlled on sertraline. No SI, no HI.  Prediabetes: stable.  Lab Results  Component Value Date   HGBA1C 6.5 05/17/2014   GERD:On prilosec 20 mg daily as needed, no reflux symptoms.  HAs been able to wean off.  Exercise: bicycling, some swimming, walking 3/4 mile at lunch break. Diet: Moderate, fruit and veggies, water. He does have trouble with eating regularly with his job at a [employer]. He has been unable to lose weight as he would have liked.  Wt Readings from Last 3 Encounters:  05/20/14 240 lb 12 oz (109.203 kg)  12/28/13 240 lb 4 oz (108.977 kg)  02/27/13 233 lb 12 oz (106.028 kg)   Lab Results  Component Value Date   CHOL 147 05/17/2014   HDL 32.00* 05/17/2014   LDLCALC 104* 05/17/2014   TRIG 54.0 05/17/2014   CHOLHDL 5 05/17/2014    Review of Systems  Constitutional: Negative for fever, fatigue and unexpected weight change.  HENT: Negative for ear pain, congestion, sore throat, rhinorrhea, trouble swallowing and postnasal drip.  Eyes: Negative for pain.  Respiratory: Negative for cough, shortness of breath and wheezing.  Cardiovascular: Negative for chest pain, palpitations and leg swelling.  Gastrointestinal: Negative for nausea, abdominal pain, diarrhea, constipation and blood in stool.  Genitourinary: Negative for dysuria, urgency, hematuria, discharge, penile swelling, scrotal swelling, difficulty urinating, penile pain and testicular pain.  Skin: Negative for rash.  Neurological: Negative for syncope, weakness, light-headedness, numbness and headaches.  Psychiatric/Behavioral: Negative for behavioral problems and dysphoric  mood. The patient is not nervous/anxious.  Objective:   Physical Exam  Constitutional: He appears well-developed and well-nourished. Non-toxic appearance. He does not appear ill. No distress.  HENT:  Head: Normocephalic and atraumatic.  Right Ear: Hearing, tympanic membrane, external ear and ear canal normal.  Left Ear: Hearing, tympanic membrane, external ear and ear canal normal.  Nose: Nose normal.  Mouth/Throat: Uvula is midline, oropharynx is clear and moist and mucous membranes are normal.  Eyes: EOM and lids are normal. Pupils are equal, round, and reactive to light. No foreign bodies found. Right eye exhibits no exudate. Left eye exhibits no exudate. Right conjunctiva is not injected. Left conjunctiva is not injected. Right eye exhibits normal extraocular motion. Left eye exhibits normal extraocular motion.  Neck: Trachea normal, normal range of motion and phonation normal. Neck supple. Carotid bruit is not present. No mass and no thyromegaly present.  Cardiovascular: Normal rate, regular rhythm, S1 normal, S2 normal, intact distal pulses and normal pulses. Exam reveals no gallop.  No murmur heard.  Pulmonary/Chest: Breath sounds normal. He has no wheezes. He has no rhonchi. He has no rales.  Abdominal: Soft. Normal appearance and bowel sounds are normal. There is no hepatosplenomegaly. There is no tenderness. There is no rebound, no guarding and no CVA tenderness. No hernia. Hernia confirmed negative in the right inguinal area and confirmed negative in the left inguinal area.  Genitourinary: Prostate normal, testes normal and penis normal. Rectal exam shows no external hemorrhoid, no internal hemorrhoid, no fissure, no mass, no tenderness and anal tone normal. Guaiac negative stool. Prostate is not enlarged and not tender. Right testis  shows no mass and no tenderness. Left testis shows no mass and no tenderness. No paraphimosis or penile tenderness.  Lymphadenopathy:  He has no  cervical adenopathy.  Right: No inguinal adenopathy present.  Left: No inguinal adenopathy present.  Neurological: He is alert. He has normal strength and normal reflexes. No cranial nerve deficit or sensory deficit. Gait normal.  No low back pain. Skin: Skin is warm, dry and intact. No rash noted.  Area of numbness in right lateral thigh. Psychiatric: He has a normal mood and affect. His speech is normal and behavior is normal. Judgment normal.  Assessment & Plan:   The patient's preventative maintenance and recommended screening tests for an annual wellness exam were reviewed in full today.  Brought up to date unless services declined.  Counselled on the importance of diet, exercise, and its role in overall health and mortality.  The patient's FH and SH was reviewed, including their home life, tobacco status, and drug and alcohol status.  Vaccines: Uptodate.  Not sure date of tdap but recent.. Will get. Non smoker. No family hx early colon cancer or prostate cancer.

## 2014-05-20 NOTE — Assessment & Plan Note (Signed)
eval with B12 and TSH other labs are normal.  No sign of radiculopathy, no back pain. 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 Info provided to pt.

## 2014-05-20 NOTE — Assessment & Plan Note (Signed)
Encouraged exercise, weight loss, healthy eating habits. Low carb diet, info given.

## 2014-05-20 NOTE — Addendum Note (Signed)
Addended by: Damita LackLORING, DONNA S on: 05/20/2014 10:10 AM   Modules accepted: Orders

## 2014-05-20 NOTE — Assessment & Plan Note (Signed)
Well controlled. Continue current medication.  

## 2014-05-20 NOTE — Patient Instructions (Addendum)
Stop at lab on way out. Work on [employer] eating,  Increase exercise and work on weight loss. Send us the date you got your tetanus.

## 2014-05-25 ENCOUNTER — Other Ambulatory Visit: Payer: Self-pay | Admitting: *Deleted

## 2014-05-25 MED ORDER — SERTRALINE HCL 100 MG PO TABS: ORAL_TABLET | ORAL | Status: DC

## 2014-10-16 ENCOUNTER — Other Ambulatory Visit: Payer: Self-pay | Admitting: Family Medicine

## 2015-02-09 ENCOUNTER — Other Ambulatory Visit: Payer: Self-pay | Admitting: Family Medicine

## 2015-07-05 LAB — HM DIABETES EYE EXAM

## 2015-07-13 ENCOUNTER — Telehealth: Payer: Self-pay | Admitting: Family Medicine

## 2015-07-13 NOTE — Telephone Encounter (Signed)
Last office visit 05/20/2014. No future appointments scheduled.  Last refilled 02/09/2015 for #90 with no refill.  Ok to refill?

## 2015-07-14 NOTE — Telephone Encounter (Signed)
Needs appt before further refills. If makes app on phone can refill until appt.

## 2015-07-14 NOTE — Telephone Encounter (Signed)
Please call and schedule CPE with fasting labs prior for Dr. Bedsole.  

## 2015-07-14 NOTE — Telephone Encounter (Signed)
Left message asking pt to call office  °

## 2015-07-21 NOTE — Telephone Encounter (Signed)
Labs 6/2 cpx  6/9 Pt aware

## 2015-07-22 ENCOUNTER — Encounter: Payer: Self-pay | Admitting: Family Medicine

## 2015-07-22 MED ORDER — SERTRALINE HCL 100 MG PO TABS: ORAL_TABLET | ORAL | Status: DC

## 2015-07-22 NOTE — Telephone Encounter (Signed)
See mychart note.Please refill pts med until appt date and let him know this has been done.

## 2015-11-04 ENCOUNTER — Telehealth: Payer: Self-pay | Admitting: Family Medicine

## 2015-11-04 ENCOUNTER — Other Ambulatory Visit (INDEPENDENT_AMBULATORY_CARE_PROVIDER_SITE_OTHER): Payer: 59

## 2015-11-04 DIAGNOSIS — Z13220 Encounter for screening for lipoid disorders: Secondary | ICD-10-CM

## 2015-11-04 DIAGNOSIS — R7303 Prediabetes: Secondary | ICD-10-CM | POA: Diagnosis not present

## 2015-11-04 LAB — COMPREHENSIVE METABOLIC PANEL
ALBUMIN: 4 g/dL (ref 3.5–5.2)
ALT: 18 U/L (ref 0–53)
AST: 15 U/L (ref 0–37)
Alkaline Phosphatase: 101 U/L (ref 39–117)
BUN: 12 mg/dL (ref 6–23)
CALCIUM: 9.5 mg/dL (ref 8.4–10.5)
CHLORIDE: 105 meq/L (ref 96–112)
CO2: 30 meq/L (ref 19–32)
Creatinine, Ser: 0.91 mg/dL (ref 0.40–1.50)
GFR: 96.92 mL/min (ref >60.00)
Glucose, Bld: 101 mg/dL — ABNORMAL HIGH (ref 70–99)
Potassium: 4.8 mEq/L (ref 3.5–5.1)
Sodium: 143 mEq/L (ref 135–145)
Total Bilirubin: 0.3 mg/dL (ref 0.2–1.2)
Total Protein: 7.2 g/dL (ref 6.0–8.3)

## 2015-11-04 LAB — LIPID PANEL
CHOL/HDL RATIO: 4
CHOLESTEROL: 152 mg/dL (ref 0–200)
HDL: 39.7 mg/dL (ref >39.00)
LDL CALC: 103 mg/dL — AB (ref 0–99)
NonHDL: 112.06
TRIGLYCERIDES: 45 mg/dL (ref 0.0–149.0)
VLDL: 9 mg/dL (ref 0.0–40.0)

## 2015-11-04 LAB — HEMOGLOBIN A1C: Hgb A1c MFr Bld: 6.8 % — ABNORMAL HIGH (ref 4.6–6.5)

## 2015-11-04 NOTE — Telephone Encounter (Signed)
-----   Message from Terri J Walsh sent at 10/27/2015 11:00 AM EDT ----- Regarding: Lab orders for Friday, 6.2.17 Patient is scheduled for CPX labs, please order future labs, Thanks , Terri  

## 2015-11-11 ENCOUNTER — Encounter: Payer: Self-pay | Admitting: Family Medicine

## 2015-11-11 ENCOUNTER — Ambulatory Visit (INDEPENDENT_AMBULATORY_CARE_PROVIDER_SITE_OTHER): Payer: 59 | Admitting: Family Medicine

## 2015-11-11 VITALS — BP 110/70 | HR 70 | Temp 98.5°F | Ht 65.5 in | Wt 248.0 lb

## 2015-11-11 DIAGNOSIS — Z23 Encounter for immunization: Secondary | ICD-10-CM

## 2015-11-11 DIAGNOSIS — Z0000 Encounter for general adult medical examination without abnormal findings: Secondary | ICD-10-CM | POA: Diagnosis not present

## 2015-11-11 DIAGNOSIS — F3340 Major depressive disorder, recurrent, in remission, unspecified: Secondary | ICD-10-CM

## 2015-11-11 DIAGNOSIS — E119 Type 2 diabetes mellitus without complications: Secondary | ICD-10-CM

## 2015-11-11 LAB — HM DIABETES FOOT EXAM

## 2015-11-11 MED ORDER — SERTRALINE HCL 100 MG PO TABS: ORAL_TABLET | ORAL | Status: DC

## 2015-11-11 NOTE — Patient Instructions (Addendum)
Work on low [place] diet, increase exercise as able, work on weight loss.  Stop at front desk on way out for nutrition referral.

## 2015-11-11 NOTE — Assessment & Plan Note (Signed)
Stable control on sertraline  

## 2015-11-11 NOTE — Addendum Note (Signed)
Addended by: Damita LackLORING, DONNA S on: 11/11/2015 10:50 AM   Modules accepted: Orders, SmartSet

## 2015-11-11 NOTE — Progress Notes (Signed)
Pre visit review using our clinic review tool, if applicable. No additional management support is needed unless otherwise documented below in the visit note. 

## 2015-11-11 NOTE — Progress Notes (Signed)
The patient is here for annual wellness exam and preventative care.  He has been doing well overall.   Depression: Well controlled on sertraline. No SI, no HI.  Prediabetes: worsened control and now in DM range! Lab Results  Component Value Date   HGBA1C 6.8* 11/04/2015  Body mass index is 40.63 kg/(m^2). Wt Readings from Last 3 Encounters:  11/11/15 248 lb (112.492 kg)  05/20/14 240 lb 12 oz (109.203 kg)  12/28/13 240 lb 4 oz (108.977 kg)   BP Readings from Last 3 Encounters:  11/11/15 110/70  05/20/14 104/70  12/28/13 124/82   GERD:On prilosec 20 mg daily as needed, no reflux symptoms.   Exercise:  walking 3-4 times a week, 3 miles. Diet: Moderate, fruit and veggies, water. He does have trouble with eating regularly with his job at a [employer] center. He has been unable to lose weight as he would have liked.  LDL almost at new goal < 100 with new diagnosis diabetes. Lab Results  Component Value Date   CHOL 152 11/04/2015   HDL 39.70 11/04/2015   LDLCALC 103* 11/04/2015   TRIG 45.0 11/04/2015   CHOLHDL 4 11/04/2015   Social History /Family History/Past Medical History reviewed and updated if needed.  Review of Systems  Constitutional: Negative for fever, fatigue and unexpected weight change.  HENT: Negative for ear pain, congestion, sore throat, rhinorrhea, trouble swallowing and postnasal drip.  Eyes: Negative for pain.  Respiratory: Negative for cough, shortness of breath and wheezing.  Cardiovascular: Negative for chest pain, palpitations and leg swelling.  Gastrointestinal: Negative for nausea, abdominal pain, diarrhea, constipation and blood in stool.  Genitourinary: Negative for dysuria, urgency, hematuria, discharge, penile swelling, scrotal swelling, difficulty urinating, penile pain and testicular pain.  Skin: Negative for rash.  Neurological: Negative for syncope, weakness, light-headedness, numbness and headaches.  Psychiatric/Behavioral: Negative  for behavioral problems and dysphoric mood. The patient is not nervous/anxious.  Objective:   Physical Exam  Constitutional: He appears well-developed and well-nourished. Non-toxic appearance. He does not appear ill. No distress.  HENT:  Head: Normocephalic and atraumatic.  Right Ear: Hearing, tympanic membrane, external ear and ear canal normal.  Left Ear: Hearing, tympanic membrane, external ear and ear canal normal.  Nose: Nose normal.  Mouth/Throat: Uvula is midline, oropharynx is clear and moist and mucous membranes are normal.  Eyes: EOM and lids are normal. Pupils are equal, round, and reactive to light. No foreign bodies found. Right eye exhibits no exudate. Left eye exhibits no exudate. Right conjunctiva is not injected. Left conjunctiva is not injected. Right eye exhibits normal extraocular motion. Left eye exhibits normal extraocular motion.  Neck: Trachea normal, normal range of motion and phonation normal. Neck supple. Carotid bruit is not present. No mass and no thyromegaly present.  Cardiovascular: Normal rate, regular rhythm, S1 normal, S2 normal, intact distal pulses and normal pulses. Exam reveals no gallop.  No murmur heard.  Pulmonary/Chest: Breath sounds normal. He has no wheezes. He has no rhonchi. He has no rales.  Abdominal: Soft. Normal appearance and bowel sounds are normal. There is no hepatosplenomegaly. There is no tenderness. There is no rebound, no guarding and no CVA tenderness. No hernia. Hernia confirmed negative in the right inguinal area and confirmed negative in the left inguinal area.  Genitourinary: Prostate normal, testes normal and penis normal. Rectal exam shows no external hemorrhoid, no internal hemorrhoid, no fissure, no mass, no tenderness and anal tone normal. Guaiac negative stool. Prostate is not enlarged and not tender.  Right testis shows no mass and no tenderness. Left testis shows no mass and no tenderness. No paraphimosis or penile  tenderness.  Lymphadenopathy:  He has no cervical adenopathy.  Right: No inguinal adenopathy present.  Left: No inguinal adenopathy present.  Neurological: He is alert. He has normal strength and normal reflexes. No cranial nerve deficit or sensory deficit. Gait normal.  No low back pain. Skin: Skin is warm, dry and intact. No rash noted. Area of numbness in right lateral thigh. Psychiatric: He has a normal mood and affect. His speech is normal and behavior is normal. Judgment normal.    Diabetic foot exam: Normal inspection No skin breakdown No calluses  Normal DP pulses Normal sensation to light touch and monofilament Nails normal  Assessment & Plan:   The patient's preventative maintenance and recommended screening tests for an annual wellness exam were reviewed in full today.  Brought up to date unless services declined.  Counselled on the importance of diet, exercise, and its role in overall health and mortality.  The patient's FH and SH was reviewed, including their home life, tobacco status, and drug and alcohol status.   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  Non smoker. No family hx early colon cancer or prostate cancer. No STD testing requested.

## 2016-02-14 ENCOUNTER — Telehealth: Payer: Self-pay | Admitting: Family Medicine

## 2016-02-14 DIAGNOSIS — E119 Type 2 diabetes mellitus without complications: Secondary | ICD-10-CM

## 2016-02-14 NOTE — Telephone Encounter (Signed)
-----   Message from Alvina Chouerri J Walsh sent at 02/08/2016  6:41 PM EDT ----- Regarding: Lab orders for Wednesday, 9.13.17 DM lab orders

## 2016-02-15 ENCOUNTER — Other Ambulatory Visit (INDEPENDENT_AMBULATORY_CARE_PROVIDER_SITE_OTHER): Payer: 59

## 2016-02-15 DIAGNOSIS — E119 Type 2 diabetes mellitus without complications: Secondary | ICD-10-CM

## 2016-02-15 LAB — MICROALBUMIN / CREATININE URINE RATIO
CREATININE, U: 216.7 mg/dL
Microalb Creat Ratio: 0.3 mg/g (ref 0.0–30.0)

## 2016-02-15 LAB — COMPREHENSIVE METABOLIC PANEL
ALBUMIN: 3.9 g/dL (ref 3.5–5.2)
ALT: 17 U/L (ref 0–53)
AST: 16 U/L (ref 0–37)
Alkaline Phosphatase: 101 U/L (ref 39–117)
BILIRUBIN TOTAL: 0.4 mg/dL (ref 0.2–1.2)
BUN: 14 mg/dL (ref 6–23)
CALCIUM: 9.3 mg/dL (ref 8.4–10.5)
CHLORIDE: 105 meq/L (ref 96–112)
CO2: 30 meq/L (ref 19–32)
CREATININE: 0.95 mg/dL (ref 0.40–1.50)
GFR: 92.1 mL/min (ref >60.00)
Glucose, Bld: 108 mg/dL — ABNORMAL HIGH (ref 70–99)
Potassium: 4.3 mEq/L (ref 3.5–5.1)
SODIUM: 142 meq/L (ref 135–145)
Total Protein: 7.2 g/dL (ref 6.0–8.3)

## 2016-02-15 LAB — LIPID PANEL
CHOLESTEROL: 147 mg/dL (ref 0–200)
HDL: 36.7 mg/dL — ABNORMAL LOW (ref >39.00)
LDL CALC: 97 mg/dL (ref 0–99)
NonHDL: 109.9
TRIGLYCERIDES: 64 mg/dL (ref 0.0–149.0)
Total CHOL/HDL Ratio: 4
VLDL: 12.8 mg/dL (ref 0.0–40.0)

## 2016-02-15 LAB — HEMOGLOBIN A1C: Hgb A1c MFr Bld: 6.8 % — ABNORMAL HIGH (ref 4.6–6.5)

## 2016-02-16 ENCOUNTER — Encounter: Payer: Self-pay | Admitting: *Deleted

## 2016-02-21 ENCOUNTER — Ambulatory Visit: Payer: 59 | Admitting: Family Medicine

## 2016-06-27 ENCOUNTER — Other Ambulatory Visit: Payer: Self-pay | Admitting: Family Medicine

## 2016-08-30 DIAGNOSIS — H40033 Anatomical narrow angle, bilateral: Secondary | ICD-10-CM | POA: Diagnosis not present

## 2016-08-30 DIAGNOSIS — H04123 Dry eye syndrome of bilateral lacrimal glands: Secondary | ICD-10-CM | POA: Diagnosis not present

## 2016-10-01 DIAGNOSIS — H04123 Dry eye syndrome of bilateral lacrimal glands: Secondary | ICD-10-CM | POA: Diagnosis not present

## 2016-11-08 DIAGNOSIS — M7541 Impingement syndrome of right shoulder: Secondary | ICD-10-CM | POA: Diagnosis not present

## 2016-11-08 DIAGNOSIS — M25521 Pain in right elbow: Secondary | ICD-10-CM | POA: Diagnosis not present

## 2016-11-12 DIAGNOSIS — M542 Cervicalgia: Secondary | ICD-10-CM | POA: Diagnosis not present

## 2016-11-15 DIAGNOSIS — M542 Cervicalgia: Secondary | ICD-10-CM | POA: Diagnosis not present

## 2016-11-15 DIAGNOSIS — M5412 Radiculopathy, cervical region: Secondary | ICD-10-CM | POA: Diagnosis not present

## 2016-11-16 DIAGNOSIS — M542 Cervicalgia: Secondary | ICD-10-CM | POA: Diagnosis not present

## 2016-11-16 DIAGNOSIS — M5412 Radiculopathy, cervical region: Secondary | ICD-10-CM | POA: Diagnosis not present

## 2016-11-19 DIAGNOSIS — M5412 Radiculopathy, cervical region: Secondary | ICD-10-CM | POA: Diagnosis not present

## 2016-11-19 DIAGNOSIS — M542 Cervicalgia: Secondary | ICD-10-CM | POA: Diagnosis not present

## 2016-11-20 DIAGNOSIS — M542 Cervicalgia: Secondary | ICD-10-CM | POA: Diagnosis not present

## 2016-11-20 DIAGNOSIS — M5412 Radiculopathy, cervical region: Secondary | ICD-10-CM | POA: Diagnosis not present

## 2016-11-21 DIAGNOSIS — M542 Cervicalgia: Secondary | ICD-10-CM | POA: Diagnosis not present

## 2016-11-21 DIAGNOSIS — M5412 Radiculopathy, cervical region: Secondary | ICD-10-CM | POA: Diagnosis not present

## 2016-11-22 DIAGNOSIS — M542 Cervicalgia: Secondary | ICD-10-CM | POA: Diagnosis not present

## 2016-11-22 DIAGNOSIS — M5412 Radiculopathy, cervical region: Secondary | ICD-10-CM | POA: Diagnosis not present

## 2016-12-03 DIAGNOSIS — M542 Cervicalgia: Secondary | ICD-10-CM | POA: Diagnosis not present

## 2016-12-03 DIAGNOSIS — M5412 Radiculopathy, cervical region: Secondary | ICD-10-CM | POA: Diagnosis not present

## 2016-12-06 DIAGNOSIS — M5412 Radiculopathy, cervical region: Secondary | ICD-10-CM | POA: Diagnosis not present

## 2016-12-06 DIAGNOSIS — M542 Cervicalgia: Secondary | ICD-10-CM | POA: Diagnosis not present

## 2016-12-10 DIAGNOSIS — M542 Cervicalgia: Secondary | ICD-10-CM | POA: Diagnosis not present

## 2016-12-10 DIAGNOSIS — M5412 Radiculopathy, cervical region: Secondary | ICD-10-CM | POA: Diagnosis not present

## 2016-12-12 DIAGNOSIS — M542 Cervicalgia: Secondary | ICD-10-CM | POA: Diagnosis not present

## 2016-12-12 DIAGNOSIS — M5412 Radiculopathy, cervical region: Secondary | ICD-10-CM | POA: Diagnosis not present

## 2016-12-17 DIAGNOSIS — M542 Cervicalgia: Secondary | ICD-10-CM | POA: Diagnosis not present

## 2016-12-17 DIAGNOSIS — M5412 Radiculopathy, cervical region: Secondary | ICD-10-CM | POA: Diagnosis not present

## 2016-12-21 DIAGNOSIS — M5412 Radiculopathy, cervical region: Secondary | ICD-10-CM | POA: Diagnosis not present

## 2016-12-21 DIAGNOSIS — M542 Cervicalgia: Secondary | ICD-10-CM | POA: Diagnosis not present

## 2016-12-25 DIAGNOSIS — M5412 Radiculopathy, cervical region: Secondary | ICD-10-CM | POA: Diagnosis not present

## 2016-12-25 DIAGNOSIS — M542 Cervicalgia: Secondary | ICD-10-CM | POA: Diagnosis not present

## 2016-12-28 DIAGNOSIS — M5412 Radiculopathy, cervical region: Secondary | ICD-10-CM | POA: Diagnosis not present

## 2016-12-28 DIAGNOSIS — M542 Cervicalgia: Secondary | ICD-10-CM | POA: Diagnosis not present

## 2017-01-01 ENCOUNTER — Other Ambulatory Visit: Payer: Self-pay | Admitting: Family Medicine

## 2017-01-01 MED ORDER — SERTRALINE HCL 100 MG PO TABS: 100.0000 mg | ORAL_TABLET | Freq: Every day | ORAL | 0 refills | Status: DC

## 2017-01-28 ENCOUNTER — Telehealth: Payer: Self-pay | Admitting: Family Medicine

## 2017-01-28 DIAGNOSIS — E119 Type 2 diabetes mellitus without complications: Secondary | ICD-10-CM

## 2017-01-28 NOTE — Telephone Encounter (Signed)
-----   Message from Alvina Chou sent at 01/21/2017  9:14 AM EDT ----- Regarding: Lab orders for Wednesday,8.28.18 Patient is scheduled for CPX labs, please order future labs, Thanks , Camelia Eng

## 2017-01-30 ENCOUNTER — Other Ambulatory Visit (INDEPENDENT_AMBULATORY_CARE_PROVIDER_SITE_OTHER): Payer: 59

## 2017-01-30 DIAGNOSIS — E119 Type 2 diabetes mellitus without complications: Secondary | ICD-10-CM

## 2017-01-30 LAB — HEMOGLOBIN A1C: HEMOGLOBIN A1C: 6.7 % — AB (ref 4.6–6.5)

## 2017-01-30 LAB — LIPID PANEL
CHOL/HDL RATIO: 4
Cholesterol: 149 mg/dL (ref 0–200)
HDL: 35.4 mg/dL — AB (ref >39.00)
LDL Cholesterol: 102 mg/dL — ABNORMAL HIGH (ref 0–99)
NONHDL: 113.29
Triglycerides: 55 mg/dL (ref 0.0–149.0)
VLDL: 11 mg/dL (ref 0.0–40.0)

## 2017-01-30 LAB — MICROALBUMIN / CREATININE URINE RATIO
Creatinine,U: 198.2 mg/dL
MICROALB/CREAT RATIO: 0.4 mg/g (ref 0.0–30.0)
Microalb, Ur: 0.7 mg/dL (ref 0.0–1.9)

## 2017-01-30 LAB — COMPREHENSIVE METABOLIC PANEL
ALK PHOS: 86 U/L (ref 39–117)
ALT: 16 U/L (ref 0–53)
AST: 14 U/L (ref 0–37)
Albumin: 3.9 g/dL (ref 3.5–5.2)
BUN: 13 mg/dL (ref 6–23)
CHLORIDE: 103 meq/L (ref 96–112)
CO2: 32 meq/L (ref 19–32)
Calcium: 9.5 mg/dL (ref 8.4–10.5)
Creatinine, Ser: 0.91 mg/dL (ref 0.40–1.50)
GFR: 96.36 mL/min (ref >60.00)
GLUCOSE: 112 mg/dL — AB (ref 70–99)
POTASSIUM: 4.2 meq/L (ref 3.5–5.1)
SODIUM: 140 meq/L (ref 135–145)
TOTAL PROTEIN: 7 g/dL (ref 6.0–8.3)
Total Bilirubin: 0.3 mg/dL (ref 0.2–1.2)

## 2017-02-08 ENCOUNTER — Encounter: Payer: Self-pay | Admitting: Family Medicine

## 2017-02-08 ENCOUNTER — Ambulatory Visit (INDEPENDENT_AMBULATORY_CARE_PROVIDER_SITE_OTHER): Payer: 59 | Admitting: Family Medicine

## 2017-02-08 VITALS — BP 120/78 | HR 75 | Temp 98.3°F | Ht 66.0 in | Wt 235.0 lb

## 2017-02-08 DIAGNOSIS — F3340 Major depressive disorder, recurrent, in remission, unspecified: Secondary | ICD-10-CM | POA: Diagnosis not present

## 2017-02-08 DIAGNOSIS — E6609 Other obesity due to excess calories: Secondary | ICD-10-CM

## 2017-02-08 DIAGNOSIS — Z0000 Encounter for general adult medical examination without abnormal findings: Secondary | ICD-10-CM | POA: Diagnosis not present

## 2017-02-08 DIAGNOSIS — E119 Type 2 diabetes mellitus without complications: Secondary | ICD-10-CM | POA: Diagnosis not present

## 2017-02-08 DIAGNOSIS — IMO0001 Reserved for inherently not codable concepts without codable children: Secondary | ICD-10-CM

## 2017-02-08 DIAGNOSIS — Z6837 Body mass index (BMI) 37.0-37.9, adult: Secondary | ICD-10-CM | POA: Diagnosis not present

## 2017-02-08 MED ORDER — SERTRALINE HCL 100 MG PO TABS: 100.0000 mg | ORAL_TABLET | Freq: Every day | ORAL | 3 refills | Status: DC

## 2017-02-08 NOTE — Patient Instructions (Addendum)
Keep up the great work with low carb diet and weight loss.  Continue exercise!

## 2017-02-08 NOTE — Assessment & Plan Note (Signed)
Encouraged exercise, weight loss, healthy eating habits. ? ?

## 2017-02-08 NOTE — Progress Notes (Signed)
Subjective:    Patient ID: William Garza, male    DOB: 05/21/1976, 41 y.o.   MRN: [id number]  HPI The patient is here for annual wellness exam and preventative care.    Depression, major moderate, well controlled on sertraline.  Diabetes:  Controlled with diet. Lab Results  Component Value Date   HGBA1C 6.7 (H) 01/30/2017  Using medications without difficulties: Hypoglycemic episodes:? Hyperglycemic episodes:? Feet problems: no ulcer Blood Sugars averaging: not checking eye exam within last year: yes.. Report requested  Body mass index is 37.93 kg/m.  He has lost 13 lbs in last year  Working on healthy eating, decreased sodas.  Exercising walking and biking 3-4 times a week. Wt Readings from Last 3 Encounters:  02/08/17 235 lb (106.6 kg)  11/11/15 248 lb (112.5 kg)  05/20/14 240 lb 12 oz (109.2 kg)    Social History /Family History/Past Medical History reviewed in detail and updated in EMR if needed. Blood pressure 120/78, pulse 75, temperature 98.3 F (36.8 C), temperature source Oral, height 5\' 6"  (1.676 m), weight 235 lb (106.6 kg).  Review of Systems  Constitutional: Negative for fatigue and fever.  HENT: Negative for ear pain.   Eyes: Negative for pain.  Respiratory: Negative for cough and shortness of breath.   Cardiovascular: Negative for chest pain, palpitations and leg swelling.  Gastrointestinal: Negative for abdominal pain.  Genitourinary: Negative for dysuria.  Musculoskeletal: Negative for arthralgias.  Neurological: Negative for syncope, light-headedness and headaches.  Psychiatric/Behavioral: Negative for dysphoric mood.       Objective:   Physical Exam  Constitutional: He appears well-developed and well-nourished.  Non-toxic appearance. He does not appear ill. No distress.  HENT:  Head: Normocephalic and atraumatic.  Right Ear: Hearing, tympanic membrane, external ear and ear canal normal.  Left Ear: Hearing, tympanic membrane, external ear  and ear canal normal.  Nose: Nose normal.  Mouth/Throat: Uvula is midline, oropharynx is clear and moist and mucous membranes are normal.  Eyes: Pupils are equal, round, and reactive to light. Conjunctivae, EOM and lids are normal. Lids are everted and swept, no foreign bodies found.  Neck: Trachea normal, normal range of motion and phonation normal. Neck supple. Carotid bruit is not present. No thyroid mass and no thyromegaly present.  Cardiovascular: Normal rate, regular rhythm, S1 normal, S2 normal, intact distal pulses and normal pulses.  Exam reveals no gallop.   No murmur heard. Pulmonary/Chest: Breath sounds normal. He has no wheezes. He has no rhonchi. He has no rales.  Abdominal: Soft. Normal appearance and bowel sounds are normal. There is no hepatosplenomegaly. There is no tenderness. There is no rebound, no guarding and no CVA tenderness. No hernia.  Lymphadenopathy:    He has no cervical adenopathy.  Neurological: He is alert. He has normal strength and normal reflexes. No cranial nerve deficit or sensory deficit. Gait normal.  Skin: Skin is warm, dry and intact. No rash noted.  Psychiatric: He has a normal mood and affect. His speech is normal and behavior is normal. Judgment normal.     Diabetic foot exam: Normal inspection No skin breakdown No calluses  Normal DP pulses Normal sensation to light touch and monofilament Nails normal      Assessment & Plan:  The patient's preventative maintenance and recommended screening tests for an annual wellness exam were reviewed in full today. Brought up to date unless services declined.  Counselled on the importance of diet, exercise, and its role in overall health and mortality. The  patient's FH and SH was reviewed, including their home life, tobacco status, and drug and alcohol status.   Vaccines: Has had tdap around 2016... We will get records  Non smoker. No family hx early colon cancer or prostate cancer. No STD testing  requested.

## 2017-02-08 NOTE — Assessment & Plan Note (Signed)
Stable control. Reviewed low carb diet and healthy lifestyle.

## 2017-02-08 NOTE — Assessment & Plan Note (Signed)
Well controlled. Continue current medication.  

## 2017-11-20 ENCOUNTER — Other Ambulatory Visit: Payer: Self-pay | Admitting: Family Medicine

## 2018-04-08 ENCOUNTER — Other Ambulatory Visit: Payer: Self-pay | Admitting: Family Medicine

## 2018-04-09 DIAGNOSIS — Z23 Encounter for immunization: Secondary | ICD-10-CM | POA: Diagnosis not present

## 2018-05-12 ENCOUNTER — Other Ambulatory Visit (INDEPENDENT_AMBULATORY_CARE_PROVIDER_SITE_OTHER): Payer: 59

## 2018-05-12 DIAGNOSIS — Z9884 Bariatric surgery status: Secondary | ICD-10-CM

## 2018-05-12 DIAGNOSIS — Z125 Encounter for screening for malignant neoplasm of prostate: Secondary | ICD-10-CM | POA: Diagnosis not present

## 2018-05-12 DIAGNOSIS — E119 Type 2 diabetes mellitus without complications: Secondary | ICD-10-CM

## 2018-05-12 DIAGNOSIS — Z0000 Encounter for general adult medical examination without abnormal findings: Secondary | ICD-10-CM | POA: Diagnosis not present

## 2018-05-12 LAB — CBC WITH DIFFERENTIAL/PLATELET
Basophils Absolute: 0 10*3/uL (ref 0.0–0.1)
Basophils Relative: 0.4 % (ref 0.0–3.0)
Eosinophils Absolute: 0.2 10*3/uL (ref 0.0–0.7)
Eosinophils Relative: 1.8 % (ref 0.0–5.0)
HCT: 36.9 % — ABNORMAL LOW (ref 39.0–52.0)
Hemoglobin: 11.9 g/dL — ABNORMAL LOW (ref 13.0–17.0)
LYMPHS PCT: 19.7 % (ref 12.0–46.0)
Lymphs Abs: 1.8 10*3/uL (ref 0.7–4.0)
MCHC: 32.2 g/dL (ref 30.0–36.0)
MCV: 81.8 fl (ref 78.0–100.0)
Monocytes Absolute: 0.8 10*3/uL (ref 0.1–1.0)
Monocytes Relative: 9 % (ref 3.0–12.0)
Neutro Abs: 6.4 10*3/uL (ref 1.4–7.7)
Neutrophils Relative %: 69.1 % (ref 43.0–77.0)
Platelets: 303 10*3/uL (ref 150.0–400.0)
RBC: 4.51 Mil/uL (ref 4.22–5.81)
RDW: 17.5 % — ABNORMAL HIGH (ref 11.5–15.5)
WBC: 9.2 10*3/uL (ref 4.0–10.5)

## 2018-05-12 LAB — MICROALBUMIN / CREATININE URINE RATIO
Creatinine,U: 204.9 mg/dL
MICROALB UR: 0.8 mg/dL (ref 0.0–1.9)
Microalb Creat Ratio: 0.4 mg/g (ref 0.0–30.0)

## 2018-05-12 LAB — COMPREHENSIVE METABOLIC PANEL
ALT: 16 U/L (ref 0–53)
AST: 14 U/L (ref 0–37)
Albumin: 4 g/dL (ref 3.5–5.2)
Alkaline Phosphatase: 97 U/L (ref 39–117)
BUN: 12 mg/dL (ref 6–23)
CALCIUM: 9.5 mg/dL (ref 8.4–10.5)
CO2: 30 mEq/L (ref 19–32)
Chloride: 105 mEq/L (ref 96–112)
Creatinine, Ser: 0.91 mg/dL (ref 0.40–1.50)
GFR: 95.79 mL/min (ref >60.00)
Glucose, Bld: 117 mg/dL — ABNORMAL HIGH (ref 70–99)
Potassium: 4.2 mEq/L (ref 3.5–5.1)
Sodium: 142 mEq/L (ref 135–145)
Total Bilirubin: 0.3 mg/dL (ref 0.2–1.2)
Total Protein: 6.7 g/dL (ref 6.0–8.3)

## 2018-05-12 LAB — LIPID PANEL
CHOL/HDL RATIO: 3
Cholesterol: 139 mg/dL (ref 0–200)
HDL: 40.7 mg/dL (ref >39.00)
LDL CALC: 87 mg/dL (ref 0–99)
NonHDL: 98.29
Triglycerides: 58 mg/dL (ref 0.0–149.0)
VLDL: 11.6 mg/dL (ref 0.0–40.0)

## 2018-05-12 LAB — PSA: PSA: 0.47 ng/mL (ref 0.10–4.00)

## 2018-05-12 LAB — HEMOGLOBIN A1C: Hgb A1c MFr Bld: 6.8 % — ABNORMAL HIGH (ref 4.6–6.5)

## 2018-05-16 ENCOUNTER — Ambulatory Visit: Payer: 59 | Admitting: Family Medicine

## 2018-05-21 DIAGNOSIS — H40033 Anatomical narrow angle, bilateral: Secondary | ICD-10-CM | POA: Diagnosis not present

## 2018-05-21 DIAGNOSIS — H04123 Dry eye syndrome of bilateral lacrimal glands: Secondary | ICD-10-CM | POA: Diagnosis not present

## 2018-06-06 ENCOUNTER — Telehealth: Payer: Self-pay | Admitting: Family Medicine

## 2018-06-06 MED ORDER — SERTRALINE HCL 100 MG PO TABS: 100.0000 mg | ORAL_TABLET | Freq: Every day | ORAL | 0 refills | Status: DC

## 2018-06-06 NOTE — Telephone Encounter (Signed)
Refill sent as requested to [facility].

## 2018-06-06 NOTE — Telephone Encounter (Signed)
Pt scheduled physical for 4/7. He states he will run out of sertraline prior to the appointment. Are we able to refill to last until April?

## 2018-06-06 NOTE — Telephone Encounter (Signed)
Left message for William Garza to call me back with name of pharmacy he would like refill sent to.

## 2018-06-06 NOTE — Telephone Encounter (Signed)
Pt's pharmacy is  [facility]

## 2018-08-03 DIAGNOSIS — H04123 Dry eye syndrome of bilateral lacrimal glands: Secondary | ICD-10-CM | POA: Diagnosis not present

## 2018-08-12 ENCOUNTER — Other Ambulatory Visit: Payer: Self-pay | Admitting: Family Medicine

## 2018-09-09 ENCOUNTER — Encounter: Payer: 59 | Admitting: Family Medicine

## 2018-09-09 ENCOUNTER — Ambulatory Visit: Payer: 59 | Admitting: Family Medicine

## 2018-09-18 ENCOUNTER — Ambulatory Visit (INDEPENDENT_AMBULATORY_CARE_PROVIDER_SITE_OTHER): Payer: 59 | Admitting: Family Medicine

## 2018-09-18 ENCOUNTER — Encounter: Payer: Self-pay | Admitting: Family Medicine

## 2018-09-18 VITALS — HR 87

## 2018-09-18 DIAGNOSIS — Z6837 Body mass index (BMI) 37.0-37.9, adult: Secondary | ICD-10-CM

## 2018-09-18 DIAGNOSIS — E119 Type 2 diabetes mellitus without complications: Secondary | ICD-10-CM

## 2018-09-18 DIAGNOSIS — D649 Anemia, unspecified: Secondary | ICD-10-CM | POA: Diagnosis not present

## 2018-09-18 DIAGNOSIS — Z9884 Bariatric surgery status: Secondary | ICD-10-CM | POA: Diagnosis not present

## 2018-09-18 DIAGNOSIS — E661 Drug-induced obesity: Secondary | ICD-10-CM

## 2018-09-18 DIAGNOSIS — F3340 Major depressive disorder, recurrent, in remission, unspecified: Secondary | ICD-10-CM

## 2018-09-18 MED ORDER — SERTRALINE HCL 100 MG PO TABS: 100.0000 mg | ORAL_TABLET | Freq: Every day | ORAL | 0 refills | Status: DC

## 2018-09-18 MED ORDER — SERTRALINE HCL 100 MG PO TABS: 100.0000 mg | ORAL_TABLET | Freq: Every day | ORAL | 1 refills | Status: DC

## 2018-09-18 NOTE — Patient Instructions (Signed)
Anemia Return for cbc with diff, B12 and iron panel.. likely low given gastric bypass history. Liekly nutritional. Asymptomatic and no blood loss noted.  Diabetes mellitus type II, controlled, with no complications (HCC) Diet controlled.  Encouraged exercise, weight loss, healthy eating habits.   Depression, major, recurrent Well controlled. Continue current medication.

## 2018-09-18 NOTE — Progress Notes (Signed)
VIRTUAL VISIT Due to national recommendations of social distancing due to COVID 19, a virtual visit is felt to be most appropriate for this patient at this time.   I connected with the patient on 09/18/18 at  3:00 PM EDT by virtual telehealth platform and verified that I am speaking with the correct person using two identifiers.   I discussed the limitations, risks, security and privacy concerns of performing an evaluation and management service by  virtual telehealth platform and the availability of in person appointments. I also discussed with the patient that there may be a patient responsible charge related to this service. The patient expressed understanding and agreed to proceed.  Patient location: Home Provider Location: [place] [facility] Participants: Kerby Nora and Rollen Sox   Chief Complaint  Patient presents with  . Medication Refill    History of Present Illness: 43 year old male presents for DM follow up.  Last OV in 2018 for CPX. None since.   Feeling well overall.  Diabetes:    Well controlled diet and lifestyle. Lab Results  Component Value Date   HGBA1C 6.8 (H) 05/12/2018  Using medications without difficulties: Hypoglycemic episodes: Hyperglycemic episodes: Feet problems: no ulcers Blood Sugars averaging:not checking eye exam within last year:  Yes 05/2018   regular exercise: 4-5 times a week   moderate diet, works at [employer] center.   MDD, recurrent :  Well controlled  On sertraline  100 mg daily. Depression screen [facility] 2/9 09/18/2018 02/08/2017  Decreased Interest 0 0  Down, Depressed, Hopeless 0 0  PHQ - 2 Score 0 0  Altered sleeping 0 0  Tired, decreased energy 0 0  Change in appetite 0 0  Feeling bad or failure about yourself  0 0  Trouble concentrating 0 0  Moving slowly or fidgety/restless 0 0  Suicidal thoughts 0 0  PHQ-9 Score 0 0  Difficult doing work/chores Not difficult at all -    Review labs.  Cholesterol improved from 3 months  ago.   new diagnosis anemia.. no blood loss, no family history of  Colon issues  hx of bariatric surgery.Marland Kitchen gastirc  good energy  COVID 19 screen No recent travel or known exposure to COVID19 The patient denies respiratory symptoms of COVID 19 at this time.  The importance of social distancing was discussed today.   Review of Systems  Constitutional: Negative for chills and fever.  HENT: Negative for congestion and ear pain.   Eyes: Negative for pain and redness.  Respiratory: Negative for cough and shortness of breath.   Cardiovascular: Negative for chest pain, palpitations and leg swelling.  Gastrointestinal: Negative for abdominal pain, blood in stool, constipation, diarrhea, nausea and vomiting.  Genitourinary: Negative for dysuria.  Musculoskeletal: Negative for falls and myalgias.  Skin: Negative for rash.  Neurological: Negative for dizziness.  Psychiatric/Behavioral: Negative for depression. The patient is not nervous/anxious.       Past Medical History:  Diagnosis Date  . Allergy     reports that he has never smoked. He has never used smokeless tobacco. He reports current alcohol use of about 1.0 - 2.0 standard drinks of alcohol per week. He reports that he does not use drugs.   Current Outpatient Medications:  .  sertraline (ZOLOFT) 100 MG tablet, TAKE 1 TABLET BY MOUTH  DAILY, Disp: 30 tablet, Rfl: 0   Observations/Objective: Pulse 87.  Physical Exam  Physical Exam Constitutional:      General: She is not in acute distress. Pulmonary:  Effort: Pulmonary effort is normal. No respiratory distress.  Neurological:     Mental Status: She is alert and oriented to person, place, and time.  Psychiatric:        Mood and Affect: Mood normal.        Behavior: Behavior normal.   Assessment and Plan  Anemia Return for cbc with diff, B12 and iron panel.. likely low given gastric bypass history. Liekly nutritional. Asymptomatic and no blood loss noted.  Diabetes  mellitus type II, controlled, with no complications (HCC) Diet controlled.  Encouraged exercise, weight loss, healthy eating habits.   Depression, major, recurrent Well controlled. Continue current medication.      I discussed the assessment and treatment plan with the patient. The patient was provided an opportunity to ask questions and all were answered. The patient agreed with the plan and demonstrated an understanding of the instructions.   The patient was advised to call back or seek an in-person evaluation if the symptoms worsen or if the condition fails to improve as anticipated.     Kerby NoraAmy Kmari Halter, MD

## 2018-09-18 NOTE — Assessment & Plan Note (Signed)
Return for cbc with diff, B12 and iron panel.. likely low given gastric bypass history. Liekly nutritional. Asymptomatic and no blood loss noted.

## 2018-09-18 NOTE — Assessment & Plan Note (Signed)
Well controlled. Continue current medication.  

## 2018-09-18 NOTE — Assessment & Plan Note (Signed)
Diet controlled. Encouraged exercise, weight loss, healthy eating habits.  

## 2018-09-19 ENCOUNTER — Telehealth: Payer: Self-pay | Admitting: Family Medicine

## 2018-09-19 NOTE — Telephone Encounter (Signed)
Left message asking pt to call office  Call to schedule curbside labs nonfasting in next week AND CPX with fasting labs prior in 6 months.

## 2018-09-19 NOTE — Progress Notes (Signed)
Will request eye exam once office re-open from pandemic.

## 2018-09-24 NOTE — Telephone Encounter (Signed)
Left message asking pt to call office  °

## 2018-09-29 NOTE — Telephone Encounter (Signed)
Left message asking pt to call office  °

## 2018-10-02 NOTE — Telephone Encounter (Signed)
cpx schedule 8/6  Sent my chart message asking pt to call office to schedule non fasting labs

## 2019-01-08 ENCOUNTER — Encounter: Payer: 59 | Admitting: Family Medicine

## 2019-02-18 ENCOUNTER — Telehealth: Payer: Self-pay | Admitting: Family Medicine

## 2019-02-18 NOTE — Telephone Encounter (Signed)
Please schedule CPE with fasting labs prior with Dr. Diona Browner for sometime in Oct.

## 2019-03-02 NOTE — Telephone Encounter (Signed)
Labs 10/22 cpx 10/30 Pt aware

## 2019-03-24 ENCOUNTER — Telehealth: Payer: Self-pay

## 2019-03-24 NOTE — Telephone Encounter (Signed)
LVM w COVID screen, back lab info and front door

## 2019-03-26 ENCOUNTER — Other Ambulatory Visit (INDEPENDENT_AMBULATORY_CARE_PROVIDER_SITE_OTHER): Payer: 59

## 2019-03-26 ENCOUNTER — Other Ambulatory Visit: Payer: Self-pay

## 2019-03-26 DIAGNOSIS — Z9884 Bariatric surgery status: Secondary | ICD-10-CM | POA: Diagnosis not present

## 2019-03-26 DIAGNOSIS — D649 Anemia, unspecified: Secondary | ICD-10-CM

## 2019-03-26 LAB — CBC WITH DIFFERENTIAL/PLATELET
Basophils Absolute: 0 10*3/uL (ref 0.0–0.1)
Basophils Relative: 0.5 % (ref 0.0–3.0)
Eosinophils Absolute: 0.2 10*3/uL (ref 0.0–0.7)
Eosinophils Relative: 2.6 % (ref 0.0–5.0)
HCT: 38.4 % — ABNORMAL LOW (ref 39.0–52.0)
Hemoglobin: 12.1 g/dL — ABNORMAL LOW (ref 13.0–17.0)
Lymphocytes Relative: 21 % (ref 12.0–46.0)
Lymphs Abs: 1.8 10*3/uL (ref 0.7–4.0)
MCHC: 31.5 g/dL (ref 30.0–36.0)
MCV: 80.7 fl (ref 78.0–100.0)
Monocytes Absolute: 0.8 10*3/uL (ref 0.1–1.0)
Monocytes Relative: 9.1 % (ref 3.0–12.0)
Neutro Abs: 5.6 10*3/uL (ref 1.4–7.7)
Neutrophils Relative %: 66.8 % (ref 43.0–77.0)
Platelets: 340 10*3/uL (ref 150.0–400.0)
RBC: 4.76 Mil/uL (ref 4.22–5.81)
RDW: 18.8 % — ABNORMAL HIGH (ref 11.5–15.5)
WBC: 8.4 10*3/uL (ref 4.0–10.5)

## 2019-03-26 LAB — VITAMIN B12: Vitamin B-12: 131 pg/mL — ABNORMAL LOW (ref 211–911)

## 2019-03-26 LAB — IBC + FERRITIN
Ferritin: 4.5 ng/mL — ABNORMAL LOW (ref 22.0–322.0)
Iron: 41 ug/dL — ABNORMAL LOW (ref 42–165)
Saturation Ratios: 8.3 % — ABNORMAL LOW (ref 20.0–50.0)
Transferrin: 353 mg/dL (ref 212.0–360.0)

## 2019-03-27 NOTE — Progress Notes (Signed)
No critical labs need to be addressed urgently. We will discuss labs in detail at upcoming office visit.   

## 2019-04-03 ENCOUNTER — Ambulatory Visit (INDEPENDENT_AMBULATORY_CARE_PROVIDER_SITE_OTHER): Payer: 59 | Admitting: Family Medicine

## 2019-04-03 ENCOUNTER — Encounter: Payer: Self-pay | Admitting: Family Medicine

## 2019-04-03 ENCOUNTER — Other Ambulatory Visit: Payer: Self-pay

## 2019-04-03 VITALS — BP 120/84 | HR 87 | Temp 98.0°F | Ht 66.0 in | Wt 244.6 lb

## 2019-04-03 DIAGNOSIS — E538 Deficiency of other specified B group vitamins: Secondary | ICD-10-CM | POA: Diagnosis not present

## 2019-04-03 DIAGNOSIS — D508 Other iron deficiency anemias: Secondary | ICD-10-CM

## 2019-04-03 DIAGNOSIS — Z9884 Bariatric surgery status: Secondary | ICD-10-CM

## 2019-04-03 DIAGNOSIS — E119 Type 2 diabetes mellitus without complications: Secondary | ICD-10-CM | POA: Diagnosis not present

## 2019-04-03 DIAGNOSIS — Z125 Encounter for screening for malignant neoplasm of prostate: Secondary | ICD-10-CM | POA: Diagnosis not present

## 2019-04-03 DIAGNOSIS — F3340 Major depressive disorder, recurrent, in remission, unspecified: Secondary | ICD-10-CM

## 2019-04-03 DIAGNOSIS — D509 Iron deficiency anemia, unspecified: Secondary | ICD-10-CM

## 2019-04-03 DIAGNOSIS — K9589 Other complications of other bariatric procedure: Secondary | ICD-10-CM

## 2019-04-03 LAB — HM DIABETES FOOT EXAM

## 2019-04-03 LAB — POCT GLYCOSYLATED HEMOGLOBIN (HGB A1C): Hemoglobin A1C: 6.6 % — AB (ref 4.0–5.6)

## 2019-04-03 MED ORDER — CYANOCOBALAMIN 1000 MCG/ML IJ SOLN: INTRAMUSCULAR | 3 refills | Status: DC

## 2019-04-03 NOTE — Assessment & Plan Note (Addendum)
Replete with home injecitons On injection every 2 weeks x 3 then monthly

## 2019-04-03 NOTE — Patient Instructions (Addendum)
Start daily ferrous sulfate.  Start  Every 2 weeks B12 injections x 3 then go to monthly.  Work heart healthy low carb diet and increase exercsie.

## 2019-04-03 NOTE — Assessment & Plan Note (Signed)
At risk for vitamin deficiencies.

## 2019-04-03 NOTE — Assessment & Plan Note (Signed)
Diet controlled. Encouraged exercise, weight loss, healthy eating habits.  

## 2019-04-03 NOTE — Assessment & Plan Note (Signed)
Trial of oral iron.

## 2019-04-03 NOTE — Progress Notes (Signed)
Chief Complaint  Patient presents with  . Annual Exam    History of Present Illness: HPI  The patient is here for annual wellness exam and preventative care.    he is a Museum/gallery curator. Has been under a lot of stress at work.  He and Wife had COVID19 in 01/2019.  He has been tired since.  Post gastric bypass:   Mild iron deficiency anemia.Marland Kitchen no longer on iron.  B12 deficiency.  Diabetes:   Good control.  Lab Results  Component Value Date   HGBA1C 6.6 (A) 04/03/2019  Using medications without difficulties: Hypoglycemic episodes: Hyperglycemic episodes: Feet problems: none Blood Sugars averaging: eye exam within last year: yes   Has not been eating as well in last few months.  Minimal exercise.  Wt Readings from Last 3 Encounters:  04/03/19 244 lb 9 oz (110.9 kg)  02/08/17 235 lb (106.6 kg)  11/11/15 248 lb (112.5 kg)   Body mass index is 39.47 kg/m.   Cholesterol at goal Lab Results  Component Value Date   CHOL 139 05/12/2018   HDL 40.70 05/12/2018   LDLCALC 87 05/12/2018   TRIG 58.0 05/12/2018   CHOLHDL 3 05/12/2018     MDD  Well controlled on sertraline, but he has been more irritable lately. pHQ 9 today 2  COVID 19 screen No recent travel or known exposure to [place] The patient denies respiratory symptoms of COVID 19 at this time.  The importance of social distancing was discussed today.   Review of Systems  Constitutional: Positive for malaise/fatigue. Negative for chills and fever.  HENT: Negative for congestion and ear pain.   Eyes: Negative for pain and redness.  Respiratory: Negative for cough and shortness of breath.   Cardiovascular: Negative for chest pain, palpitations and leg swelling.  Gastrointestinal: Negative for abdominal pain, blood in stool, constipation, diarrhea, nausea and vomiting.  Genitourinary: Negative for dysuria.  Musculoskeletal: Negative for falls and myalgias.  Skin: Negative for rash.  Neurological: Negative for dizziness.   Psychiatric/Behavioral: Negative for depression. The patient is not nervous/anxious.       Past Medical History:  Diagnosis Date  . Allergy     reports that he has never smoked. He has never used smokeless tobacco. He reports current alcohol use of about 1.0 - 2.0 standard drinks of alcohol per week. He reports that he does not use drugs.   Current Outpatient Medications:  .  sertraline (ZOLOFT) 100 MG tablet, TAKE 1 TABLET BY MOUTH  DAILY, Disp: 90 tablet, Rfl: 0   Observations/Objective: Blood pressure 120/84, pulse 87, temperature 98 F (36.7 C), temperature source Temporal, height 5\' 6"  (1.676 m), weight 244 lb 9 oz (110.9 kg), SpO2 98 %.  Physical Exam Constitutional:      General: He is not in acute distress.    Appearance: Normal appearance. He is well-developed. He is obese. He is not ill-appearing or toxic-appearing.  HENT:     Head: Normocephalic and atraumatic.     Right Ear: Hearing, tympanic membrane, ear canal and external ear normal.     Left Ear: Hearing, tympanic membrane, ear canal and external ear normal.     Nose: Nose normal.     Mouth/Throat:     Pharynx: Uvula midline.  Eyes:     General: Lids are normal. Lids are everted, no foreign bodies appreciated.     Conjunctiva/sclera: Conjunctivae normal.     Pupils: Pupils are equal, round, and reactive to light.  Neck:  Musculoskeletal: Normal range of motion and neck supple.     Thyroid: No thyroid mass or thyromegaly.     Vascular: No carotid bruit.     Trachea: Trachea and phonation normal.  Cardiovascular:     Rate and Rhythm: Normal rate and regular rhythm.     Pulses: Normal pulses.     Heart sounds: S1 normal and S2 normal. No murmur. No gallop.   Pulmonary:     Breath sounds: Normal breath sounds. No wheezing, rhonchi or rales.  Abdominal:     General: Bowel sounds are normal.     Palpations: Abdomen is soft.     Tenderness: There is no abdominal tenderness. There is no guarding or rebound.      Hernia: No hernia is present.  Lymphadenopathy:     Cervical: No cervical adenopathy.  Skin:    General: Skin is warm and dry.     Findings: No rash.  Neurological:     Mental Status: He is alert.     Cranial Nerves: No cranial nerve deficit.     Sensory: No sensory deficit.     Gait: Gait normal.     Deep Tendon Reflexes: Reflexes are normal and symmetric.  Psychiatric:        Speech: Speech normal.        Behavior: Behavior normal.        Judgment: Judgment normal.      Diabetic foot exam: Normal inspection No skin breakdown No calluses  Normal DP pulses Normal sensation to light touch and monofilament Nails normal  Assessment and Plan   The patient's preventative maintenance and recommended screening tests for an annual wellness exam were reviewed in full today. Brought up to date unless services declined.  Counselled on the importance of diet, exercise, and its role in overall health and mortality. The patient's FH and SH was reviewed, including their home life, tobacco status, and drug and alcohol status.   Vaccines: Has had tdap around 2016.Marland KitchenMarland Kitchenper pt.. unable to get records Non smoker. No family hx early colon cancer or prostate cancer. No STD testing requested.  Depression, major, recurrent  Stable control on sertraline.  Work on stress reduction.  Diabetes mellitus type II, controlled, with no complications (HCC) Diet controlled.Encouraged exercise, weight loss, healthy eating habits.   S/P bariatric surgery At risk for vitamin deficiencies.  B12 deficiency Replete with home injecitons On injection every 2 weeks x 3 then monthly  Iron deficiency anemia following bariatric surgery  Trial of oral iron.    Kerby Nora, MD

## 2019-04-03 NOTE — Assessment & Plan Note (Signed)
Stable control on sertraline.  Work on stress reduction.

## 2019-07-23 ENCOUNTER — Ambulatory Visit: Payer: 59 | Admitting: Family Medicine

## 2019-09-27 ENCOUNTER — Telehealth: Payer: Self-pay | Admitting: Family Medicine

## 2019-09-28 NOTE — Telephone Encounter (Signed)
Patient scheduled  5/5 labs 5/14 follow up

## 2019-09-28 NOTE — Telephone Encounter (Signed)
Please schedule patient for follow up diabetes with fasting labs prior with Dr. Ermalene Searing.

## 2019-10-07 ENCOUNTER — Other Ambulatory Visit (INDEPENDENT_AMBULATORY_CARE_PROVIDER_SITE_OTHER): Payer: 59

## 2019-10-07 DIAGNOSIS — Z125 Encounter for screening for malignant neoplasm of prostate: Secondary | ICD-10-CM

## 2019-10-07 DIAGNOSIS — E119 Type 2 diabetes mellitus without complications: Secondary | ICD-10-CM | POA: Diagnosis not present

## 2019-10-07 DIAGNOSIS — D508 Other iron deficiency anemias: Secondary | ICD-10-CM

## 2019-10-07 DIAGNOSIS — E538 Deficiency of other specified B group vitamins: Secondary | ICD-10-CM

## 2019-10-07 LAB — LIPID PANEL
Cholesterol: 147 mg/dL (ref 0–200)
HDL: 41 mg/dL (ref >39.00)
LDL Cholesterol: 91 mg/dL (ref 0–99)
NonHDL: 105.63
Total CHOL/HDL Ratio: 4
Triglycerides: 74 mg/dL (ref 0.0–149.0)
VLDL: 14.8 mg/dL (ref 0.0–40.0)

## 2019-10-07 LAB — COMPREHENSIVE METABOLIC PANEL
ALT: 19 U/L (ref 0–53)
AST: 16 U/L (ref 0–37)
Albumin: 3.8 g/dL (ref 3.5–5.2)
Alkaline Phosphatase: 93 U/L (ref 39–117)
BUN: 14 mg/dL (ref 6–23)
CO2: 30 mEq/L (ref 19–32)
Calcium: 9.3 mg/dL (ref 8.4–10.5)
Chloride: 105 mEq/L (ref 96–112)
Creatinine, Ser: 0.89 mg/dL (ref 0.40–1.50)
GFR: 91.89 mL/min (ref >60.00)
Glucose, Bld: 139 mg/dL — ABNORMAL HIGH (ref 70–99)
Potassium: 4.4 mEq/L (ref 3.5–5.1)
Sodium: 141 mEq/L (ref 135–145)
Total Bilirubin: 0.3 mg/dL (ref 0.2–1.2)
Total Protein: 6.7 g/dL (ref 6.0–8.3)

## 2019-10-07 LAB — MICROALBUMIN / CREATININE URINE RATIO
Creatinine,U: 221.6 mg/dL
Microalb Creat Ratio: 0.6 mg/g (ref 0.0–30.0)
Microalb, Ur: 1.2 mg/dL (ref 0.0–1.9)

## 2019-10-07 LAB — IBC + FERRITIN
Ferritin: 4.3 ng/mL — ABNORMAL LOW (ref 22.0–322.0)
Iron: 30 ug/dL — ABNORMAL LOW (ref 42–165)
Saturation Ratios: 6.3 % — ABNORMAL LOW (ref 20.0–50.0)
Transferrin: 340 mg/dL (ref 212.0–360.0)

## 2019-10-07 LAB — VITAMIN B12: Vitamin B-12: >1500 pg/mL — ABNORMAL HIGH (ref 211–911)

## 2019-10-07 LAB — PSA: PSA: 0.44 ng/mL (ref 0.10–4.00)

## 2019-10-07 LAB — HEMOGLOBIN A1C: Hgb A1c MFr Bld: 7.2 % — ABNORMAL HIGH (ref 4.6–6.5)

## 2019-10-08 NOTE — Progress Notes (Signed)
No critical labs need to be addressed urgently. We will discuss labs in detail at upcoming office visit.   

## 2019-10-16 ENCOUNTER — Ambulatory Visit: Payer: 59 | Admitting: Family Medicine

## 2019-10-16 DIAGNOSIS — Z0289 Encounter for other administrative examinations: Secondary | ICD-10-CM

## 2019-12-01 ENCOUNTER — Ambulatory Visit: Payer: 59 | Admitting: Family Medicine

## 2019-12-04 ENCOUNTER — Other Ambulatory Visit: Payer: Self-pay | Admitting: Family Medicine

## 2019-12-08 ENCOUNTER — Encounter: Payer: Self-pay | Admitting: Family Medicine

## 2019-12-08 ENCOUNTER — Ambulatory Visit: Payer: 59 | Admitting: Family Medicine

## 2019-12-08 ENCOUNTER — Other Ambulatory Visit: Payer: Self-pay

## 2019-12-08 VITALS — BP 118/80 | HR 88 | Temp 97.2°F | Ht 66.0 in | Wt 243.4 lb

## 2019-12-08 DIAGNOSIS — F3340 Major depressive disorder, recurrent, in remission, unspecified: Secondary | ICD-10-CM | POA: Diagnosis not present

## 2019-12-08 MED ORDER — SERTRALINE HCL 100 MG PO TABS: 150.0000 mg | ORAL_TABLET | Freq: Every day | ORAL | 1 refills | Status: DC

## 2019-12-08 NOTE — Assessment & Plan Note (Signed)
Increase sertraline to 160 mg daily. Change to bedtime dosing for sleep assistance.. if not helping we can try trazodone at night as well. Follow up mood in 4 week.

## 2019-12-08 NOTE — Progress Notes (Signed)
Chief Complaint  Patient presents with  . Medication Management    Discuss medication dosing of Sertraline. Pt is having increased stressed/emotional - feels he needs something stronger    History of Present Illness: HPI   44 year old male patient with history of MDD presents for worsening symptoms.  Depressed Mood:  Inadequate control in last   2 months on sertraline 100 mg daily. Increased stress.  Works for [employer]. Currently seperated from wife. Sleep decreased/ Insomnia/Early awakening:  Yes.. sleeps 6-7 hours but has to use tylenol PM. Interest decreased in activities: yes Guilt or worthlessness:yes Energy decreased:yes Concentration difficulties:yes Appetite disturbance or weight loss:none Psychomotor retardation/agitation:yes Suicidal thoughts: None  Has never been on higher dose of sertraline He is going through counseling with wife's employee and also at [facility].  Wt Readings from Last 3 Encounters:  12/08/19 243 lb 6.4 oz (110.4 kg)  04/03/19 244 lb 9 oz (110.9 kg)  02/08/17 235 lb (106.6 kg)    This visit occurred during the SARS-CoV-2 public health emergency.  Safety protocols were in place, including screening questions prior to the visit, additional usage of staff PPE, and extensive cleaning of exam room while observing appropriate contact time as indicated for disinfecting solutions.   COVID 19 screen:  No recent travel or known exposure to COVID19 The patient denies respiratory symptoms of COVID 19 at this time. The importance of social distancing was discussed today.     Review of Systems  Constitutional: Negative for chills and fever.  HENT: Negative for congestion and ear pain.   Eyes: Negative for pain and redness.  Respiratory: Negative for cough and shortness of breath.   Cardiovascular: Negative for chest pain, palpitations and leg swelling.  Gastrointestinal: Negative for abdominal pain, blood in stool, constipation, diarrhea, nausea and vomiting.   Genitourinary: Negative for dysuria.  Musculoskeletal: Negative for falls and myalgias.  Skin: Negative for rash.  Neurological: Negative for dizziness.  Psychiatric/Behavioral: Positive for depression. Negative for hallucinations, substance abuse and suicidal ideas. The patient has insomnia. The patient is not nervous/anxious.       Past Medical History:  Diagnosis Date  . Allergy     reports that he has never smoked. He has never used smokeless tobacco. He reports current alcohol use of about 1.0 - 2.0 standard drink of alcohol per week. He reports that he does not use drugs.   Current Outpatient Medications:  .  B-D 3CC LUER-LOK SYR 23GX1" 23G X 1" 3 ML MISC, USE AS DIRECTED, Disp: 15 each, Rfl: 3 .  B-D DISP NEEDLE 25GX1" 25G X 1" MISC, USE AS DIRECTED, Disp: 15 each, Rfl: 3 .  cyanocobalamin (,VITAMIN B-12,) 1000 MCG/ML injection, 1 ml  injection every 2 weeks x 3 then monthly, Disp: 10 mL, Rfl: 3 .  sertraline (ZOLOFT) 100 MG tablet, TAKE 1 TABLET BY MOUTH  DAILY, Disp: 90 tablet, Rfl: 3   Observations/Objective: Blood pressure 118/80, pulse 88, temperature (!) 97.2 F (36.2 C), temperature source Temporal, height 5\' 6"  (1.676 m), weight 243 lb 6.4 oz (110.4 kg), SpO2 97 %.  Physical Exam Constitutional:      Appearance: He is well-developed.  HENT:     Head: Normocephalic.     Right Ear: Hearing normal.     Left Ear: Hearing normal.     Nose: Nose normal.  Neck:     Thyroid: No thyroid mass or thyromegaly.     Vascular: No carotid bruit.     Trachea: Trachea normal.  Cardiovascular:     Rate and Rhythm: Normal rate and regular rhythm.     Pulses: Normal pulses.     Heart sounds: Heart sounds not distant. No murmur heard.  No friction rub. No gallop.      Comments: No peripheral edema Pulmonary:     Effort: Pulmonary effort is normal. No respiratory distress.     Breath sounds: Normal breath sounds.  Skin:    General: Skin is warm and dry.     Findings: No rash.   Psychiatric:        Speech: Speech normal.        Behavior: Behavior normal.        Thought Content: Thought content normal.      Assessment and Plan    Depression, major, recurrent Increase sertraline to 160 mg daily. Change to bedtime dosing for sleep assistance.. if not helping we can try trazodone at night as well. Follow up mood in 4 week.    Kerby Nora, MD

## 2019-12-08 NOTE — Patient Instructions (Signed)
Increase dose of sertraline to 150 mg .. change to bedtime dosing.

## 2020-04-27 ENCOUNTER — Other Ambulatory Visit: Payer: Self-pay | Admitting: Family Medicine

## 2020-07-20 ENCOUNTER — Other Ambulatory Visit: Payer: Self-pay | Admitting: Family Medicine

## 2020-07-20 NOTE — Telephone Encounter (Signed)
Last office visit 12/08/2019 for recurrent MDD.  AVS stated to follow up in 4 weeks.  No future appointment.  Dosing stated to increase to 160 mg but I think it was suppose to be 150 mg.  Refill?

## 2020-12-26 ENCOUNTER — Other Ambulatory Visit: Payer: Self-pay | Admitting: Family Medicine

## 2020-12-26 NOTE — Telephone Encounter (Signed)
Please schedule CPE with fasting labs prior with Dr. Ermalene Searing.  Once scheduled send back to me to refill medications.

## 2020-12-27 MED ORDER — BD LUER-LOK SYRINGE 23G X 1" 3 ML MISC
0 refills | Status: DC
Start: 2020-12-27 — End: 2021-03-17

## 2020-12-27 NOTE — Telephone Encounter (Signed)
Spoke with patient scheduled CPE with labs prior 

## 2021-02-03 ENCOUNTER — Telehealth: Payer: Self-pay | Admitting: Family Medicine

## 2021-02-03 DIAGNOSIS — Z1159 Encounter for screening for other viral diseases: Secondary | ICD-10-CM

## 2021-02-03 DIAGNOSIS — K9589 Other complications of other bariatric procedure: Secondary | ICD-10-CM

## 2021-02-03 DIAGNOSIS — D508 Other iron deficiency anemias: Secondary | ICD-10-CM

## 2021-02-03 DIAGNOSIS — E119 Type 2 diabetes mellitus without complications: Secondary | ICD-10-CM

## 2021-02-03 DIAGNOSIS — E538 Deficiency of other specified B group vitamins: Secondary | ICD-10-CM

## 2021-02-03 NOTE — Telephone Encounter (Signed)
-----   Message from Alvina Chou sent at 01/23/2021 11:00 AM EDT ----- Regarding: Lab orders for Wednesday, 9.8.22 Patient is scheduled for CPX labs, please order future labs, Thanks , Camelia Eng

## 2021-02-08 ENCOUNTER — Other Ambulatory Visit: Payer: Self-pay

## 2021-02-08 ENCOUNTER — Other Ambulatory Visit (INDEPENDENT_AMBULATORY_CARE_PROVIDER_SITE_OTHER): Payer: 59

## 2021-02-08 DIAGNOSIS — E119 Type 2 diabetes mellitus without complications: Secondary | ICD-10-CM

## 2021-02-08 DIAGNOSIS — Z1159 Encounter for screening for other viral diseases: Secondary | ICD-10-CM | POA: Diagnosis not present

## 2021-02-08 DIAGNOSIS — K9589 Other complications of other bariatric procedure: Secondary | ICD-10-CM

## 2021-02-08 DIAGNOSIS — E538 Deficiency of other specified B group vitamins: Secondary | ICD-10-CM | POA: Diagnosis not present

## 2021-02-08 DIAGNOSIS — D508 Other iron deficiency anemias: Secondary | ICD-10-CM

## 2021-02-08 LAB — CBC WITH DIFFERENTIAL/PLATELET
Basophils Absolute: 0 10*3/uL (ref 0.0–0.1)
Basophils Relative: 0.4 % (ref 0.0–3.0)
Eosinophils Absolute: 0.2 10*3/uL (ref 0.0–0.7)
Eosinophils Relative: 1.9 % (ref 0.0–5.0)
HCT: 34.8 % — ABNORMAL LOW (ref 39.0–52.0)
Hemoglobin: 10.9 g/dL — ABNORMAL LOW (ref 13.0–17.0)
Lymphocytes Relative: 15.5 % (ref 12.0–46.0)
Lymphs Abs: 1.4 10*3/uL (ref 0.7–4.0)
MCHC: 31.4 g/dL (ref 30.0–36.0)
MCV: 76.4 fl — ABNORMAL LOW (ref 78.0–100.0)
Monocytes Absolute: 0.7 10*3/uL (ref 0.1–1.0)
Monocytes Relative: 7.7 % (ref 3.0–12.0)
Neutro Abs: 6.5 10*3/uL (ref 1.4–7.7)
Neutrophils Relative %: 74.5 % (ref 43.0–77.0)
Platelets: 342 10*3/uL (ref 150.0–400.0)
RBC: 4.55 Mil/uL (ref 4.22–5.81)
RDW: 18.8 % — ABNORMAL HIGH (ref 11.5–15.5)
WBC: 8.7 10*3/uL (ref 4.0–10.5)

## 2021-02-08 LAB — COMPREHENSIVE METABOLIC PANEL
ALT: 14 U/L (ref 0–53)
AST: 12 U/L (ref 0–37)
Albumin: 3.6 g/dL (ref 3.5–5.2)
Alkaline Phosphatase: 104 U/L (ref 39–117)
BUN: 12 mg/dL (ref 6–23)
CO2: 27 mEq/L (ref 19–32)
Calcium: 9 mg/dL (ref 8.4–10.5)
Chloride: 105 mEq/L (ref 96–112)
Creatinine, Ser: 0.95 mg/dL (ref 0.40–1.50)
GFR: 95.21 mL/min (ref >60.00)
Glucose, Bld: 113 mg/dL — ABNORMAL HIGH (ref 70–99)
Potassium: 4.1 mEq/L (ref 3.5–5.1)
Sodium: 140 mEq/L (ref 135–145)
Total Bilirubin: 0.4 mg/dL (ref 0.2–1.2)
Total Protein: 6.6 g/dL (ref 6.0–8.3)

## 2021-02-08 LAB — IBC + FERRITIN
Ferritin: 4.6 ng/mL — ABNORMAL LOW (ref 22.0–322.0)
Iron: 42 ug/dL (ref 42–165)
Saturation Ratios: 9 % — ABNORMAL LOW (ref 20.0–50.0)
TIBC: 467.6 ug/dL — ABNORMAL HIGH (ref 250.0–450.0)
Transferrin: 334 mg/dL (ref 212.0–360.0)

## 2021-02-08 LAB — VITAMIN B12: Vitamin B-12: 177 pg/mL — ABNORMAL LOW (ref 211–911)

## 2021-02-08 LAB — LIPID PANEL
Cholesterol: 136 mg/dL (ref 0–200)
HDL: 38.9 mg/dL — ABNORMAL LOW (ref >39.00)
LDL Cholesterol: 85 mg/dL (ref 0–99)
NonHDL: 96.61
Total CHOL/HDL Ratio: 3
Triglycerides: 60 mg/dL (ref 0.0–149.0)
VLDL: 12 mg/dL (ref 0.0–40.0)

## 2021-02-08 LAB — HEMOGLOBIN A1C: Hgb A1c MFr Bld: 8 % — ABNORMAL HIGH (ref 4.6–6.5)

## 2021-02-09 LAB — HEPATITIS C ANTIBODY
Hepatitis C Ab: NONREACTIVE
SIGNAL TO CUT-OFF: 0.01 (ref <1.00)

## 2021-02-14 ENCOUNTER — Encounter: Payer: 59 | Admitting: Family Medicine

## 2021-02-15 ENCOUNTER — Telehealth: Payer: Self-pay | Admitting: *Deleted

## 2021-02-15 NOTE — Telephone Encounter (Signed)
-----   Message from Excell Seltzer, MD sent at 02/13/2021  5:15 PM EDT ----- Patient has not viewed MyChart per my review.. please call to make sure aware of the results and result note.

## 2021-02-15 NOTE — Progress Notes (Signed)
Cindy,  Please try to reschedule his CPE.   He really  needs an appointment to discuss his lab results.

## 2021-02-15 NOTE — Telephone Encounter (Signed)
Left message for William Garza to please call office back to reschedule his CPE or at least schedule an appointment (virtual is okay) to discuss lab results.

## 2021-02-21 ENCOUNTER — Encounter: Payer: 59 | Admitting: Family Medicine

## 2021-02-28 ENCOUNTER — Telehealth (INDEPENDENT_AMBULATORY_CARE_PROVIDER_SITE_OTHER): Payer: 59 | Admitting: Family Medicine

## 2021-02-28 ENCOUNTER — Other Ambulatory Visit: Payer: Self-pay

## 2021-02-28 ENCOUNTER — Encounter: Payer: Self-pay | Admitting: Family Medicine

## 2021-02-28 VITALS — HR 79 | Ht 65.0 in

## 2021-02-28 DIAGNOSIS — K9589 Other complications of other bariatric procedure: Secondary | ICD-10-CM

## 2021-02-28 DIAGNOSIS — F3340 Major depressive disorder, recurrent, in remission, unspecified: Secondary | ICD-10-CM

## 2021-02-28 DIAGNOSIS — E118 Type 2 diabetes mellitus with unspecified complications: Secondary | ICD-10-CM | POA: Diagnosis not present

## 2021-02-28 DIAGNOSIS — M25551 Pain in right hip: Secondary | ICD-10-CM | POA: Insufficient documentation

## 2021-02-28 DIAGNOSIS — E538 Deficiency of other specified B group vitamins: Secondary | ICD-10-CM | POA: Diagnosis not present

## 2021-02-28 DIAGNOSIS — D508 Other iron deficiency anemias: Secondary | ICD-10-CM

## 2021-02-28 DIAGNOSIS — E1165 Type 2 diabetes mellitus with hyperglycemia: Secondary | ICD-10-CM

## 2021-02-28 DIAGNOSIS — E661 Drug-induced obesity: Secondary | ICD-10-CM

## 2021-02-28 DIAGNOSIS — Z6837 Body mass index (BMI) 37.0-37.9, adult: Secondary | ICD-10-CM

## 2021-02-28 MED ORDER — SERTRALINE HCL 100 MG PO TABS: ORAL_TABLET | ORAL | 1 refills | Status: DC

## 2021-02-28 MED ORDER — METFORMIN HCL 500 MG PO TABS: 500.0000 mg | ORAL_TABLET | Freq: Two times a day (BID) | ORAL | 3 refills | Status: DC

## 2021-02-28 NOTE — Patient Instructions (Addendum)
Voltaren gel  or cream apply to area 4 times daily. Make an appt for  further evaluation if not improving. Take B12 injections  q 2 weeks for 1 month then continue monthly.  Start ferrous sulfate 325 mg daily. Plan repeat iron panel and cbc in 3 months.  Start metformin twice daily.  Work on lifestyle changes.

## 2021-02-28 NOTE — Progress Notes (Signed)
VIRTUAL VISIT Due to national recommendations of social distancing due to COVID 19, a virtual visit is felt to be most appropriate for this patient at this time.   I connected with the patient on 02/28/21 at 10:20 AM EDT by virtual telehealth platform and verified that I am speaking with the correct person using two identifiers.   I discussed the limitations, risks, security and privacy concerns of performing an evaluation and management service by  virtual telehealth platform and the availability of in person appointments. I also discussed with the patient that there may be a patient responsible charge related to this service. The patient expressed understanding and agreed to proceed.  Patient location: Home Provider Location: [facility] Participants: Kerby Nora and Rollen Sox   Chief Complaint  Patient presents with   Medication Refill   Discuss Lab Results   Leg Pain    Right Upper Outer Thigh    History of Present Illness:  45 year old male presents for follow up diabetes.  He has been feeling well overall.  He has had lateral thigh pain after standing a long time at  work.  Occuring off and on for last 5 month  No back and buttock.  No weakness, no numbness.   Diabetes: Worsened control on no medication. He has been less active given leg issue.  He is still eating low carb and small meal size. Lab Results  Component Value Date   HGBA1C 8.0 (H) 02/08/2021  Using medications without difficulties: Hypoglycemic episodes: Hyperglycemic episodes: Feet problems: no ulcers Blood Sugars averaging: eye exam within last year: yes   He has gained weight back with the relative inactivity.   Elevated Cholesterol: LDL at goal but he is not on statin despite need given DM. Lab Results  Component Value Date   CHOL 136 02/08/2021   HDL 38.90 (L) 02/08/2021   LDLCALC 85 02/08/2021   TRIG 60.0 02/08/2021   CHOLHDL 3 02/08/2021  Using medications without  problems: Muscle aches:  Diet compliance: good Exercise:none Other complaints:   B12 low.. he has not been keeping up with B12 injections as well.. will restart.  Iron deficiency anemia.Marland Kitchen likely due to bariatric history  Worsened anemia Hg 10.9 compared with 12.1 in 2021 Low ferritin and borderline low total iron.  No visual blood loss.   MDD: Well controlled on sertraline 150 mg at bedtime. Depression screen [facility] 2/9 02/28/2021 12/08/2019 04/03/2019  Decreased Interest 0 1 0  Down, Depressed, Hopeless 0 2 0  PHQ - 2 Score 0 3 0  Altered sleeping 1 3 0  Tired, decreased energy 0 3 1  Change in appetite 0 0 0  Feeling bad or failure about yourself  0 3 0  Trouble concentrating 0 2 1  Moving slowly or fidgety/restless 0 0 0  Suicidal thoughts 0 0 0  PHQ-9 Score 1 14 2   Difficult doing work/chores Not difficult at all Very difficult Not difficult at all      COVID 19 screen No recent travel or known exposure to COVID19 The patient denies respiratory symptoms of COVID 19 at this time.  The importance of social distancing was discussed today.   Review of Systems  Constitutional:  Negative for chills and fever.  HENT:  Negative for congestion and ear pain.   Eyes:  Negative for pain and redness.  Respiratory:  Negative for cough and shortness of breath.   Cardiovascular:  Negative for chest pain, palpitations and leg swelling.  Gastrointestinal:  Negative for abdominal pain, blood in stool, constipation, diarrhea, nausea and vomiting.  Genitourinary:  Negative for dysuria.  Musculoskeletal:  Negative for falls and myalgias.  Skin:  Negative for rash.  Neurological:  Negative for dizziness.  Psychiatric/Behavioral:  Negative for depression. The patient is not nervous/anxious.      Past Medical History:  Diagnosis Date   Allergy     reports that he has never smoked. He has never used smokeless tobacco. He reports current alcohol use of about 1.0 - 2.0 standard drink per week.  He reports that he does not use drugs.   Current Outpatient Medications:    B-D DISP NEEDLE 25GX1" 25G X 1" MISC, USE AS DIRECTED, Disp: 3 each, Rfl: 0   cyanocobalamin (,VITAMIN B-12,) 1000 MCG/ML injection, INJECT INTRAMUSCULARLY  MONTHLY (DISCARD 28 DAYS  AFTER FIRST USE), Disp: 3 mL, Rfl: 0   sertraline (ZOLOFT) 100 MG tablet, TAKE 1 AND 1/2 TABLETS BY  MOUTH AT BEDTIME, Disp: 135 tablet, Rfl: 0   SYRINGE-NEEDLE, DISP, 3 ML (B-D 3CC LUER-LOK SYR 23GX1") 23G X 1" 3 ML MISC, USE AS DIRECTED, Disp: 15 each, Rfl: 0   Observations/Objective: Pulse 79, height 5\' 5"  (1.651 m).  Physical Exam  Physical Exam Constitutional:      General: The patient is not in acute distress. Pulmonary:     Effort: Pulmonary effort is normal. No respiratory distress.  Neurological:     Mental Status: The patient is alert and oriented to person, place, and time.  Psychiatric:        Mood and Affect: Mood normal.        Behavior: Behavior normal.   Assessment and Plan Problem List Items Addressed This Visit     Acute right hip pain   B12 deficiency    Take B12 injections  q 2 weeks for 1 month then continue monthly.      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   Will look into GLP1i in former bariatric surgery patient.      Relevant Medications   metFORMIN (GLUCOPHAGE) 500 MG tablet   Depression, major, recurrent (HCC)   Relevant Medications   sertraline (ZOLOFT) 100 MG tablet   Diabetes mellitus type II, controlled, with no complications (HCC) - Primary   Relevant Medications   metFORMIN (GLUCOPHAGE) 500 MG tablet   Iron deficiency anemia following bariatric surgery     Start ferrous sulfate 325 mg daily. Plan repeat iron panel and cbc in 3 months.         I discussed the assessment and treatment plan with the patient. The patient was provided an opportunity to ask questions and all  were answered. The patient agreed with the plan and demonstrated an understanding of the instructions.   The patient was advised to call back or seek an in-person evaluation if the symptoms worsen or if the condition fails to improve as anticipated.     , MD

## 2021-02-28 NOTE — Assessment & Plan Note (Signed)
Start ferrous sulfate 325 mg daily. Plan repeat iron panel and cbc in 3 months.

## 2021-02-28 NOTE — Assessment & Plan Note (Signed)
Encouraged exercise, weight loss, healthy eating habits.   Will look into GLP1i in former bariatric surgery patient.

## 2021-02-28 NOTE — Assessment & Plan Note (Signed)
Take B12 injections  q 2 weeks for 1 month then continue monthly.

## 2021-03-14 ENCOUNTER — Other Ambulatory Visit: Payer: Self-pay | Admitting: Family Medicine

## 2021-03-15 ENCOUNTER — Other Ambulatory Visit: Payer: Self-pay

## 2021-03-15 DIAGNOSIS — E538 Deficiency of other specified B group vitamins: Secondary | ICD-10-CM

## 2021-03-15 MED ORDER — METFORMIN HCL 500 MG PO TABS: 500.0000 mg | ORAL_TABLET | Freq: Two times a day (BID) | ORAL | 3 refills | Status: DC

## 2021-03-24 MED ORDER — BD LUER-LOK SYRINGE 23G X 1" 3 ML MISC
0 refills | Status: DC
Start: 2021-03-24 — End: 2021-11-26

## 2021-03-24 MED ORDER — CYANOCOBALAMIN 1000 MCG/ML IJ SOLN: INTRAMUSCULAR | 4 refills | Status: DC

## 2021-03-24 MED ORDER — BD DISP NEEDLE 25G X 1" MISC
0 refills | Status: DC
Start: 2021-03-24 — End: 2021-11-26

## 2021-03-24 NOTE — Telephone Encounter (Signed)
[facility] sent fax needing clarification on Vitamin B12 prescription.  New Rx sent with more specific instructions.

## 2021-03-24 NOTE — Addendum Note (Signed)
Addended by: Damita Lack on: 03/24/2021 03:10 PM   Modules accepted: Orders

## 2021-04-06 ENCOUNTER — Other Ambulatory Visit: Payer: Self-pay | Admitting: Family Medicine

## 2021-04-06 DIAGNOSIS — E538 Deficiency of other specified B group vitamins: Secondary | ICD-10-CM

## 2021-06-15 ENCOUNTER — Encounter: Payer: Self-pay | Admitting: Family Medicine

## 2021-06-20 ENCOUNTER — Telehealth: Payer: Self-pay | Admitting: *Deleted

## 2021-06-20 DIAGNOSIS — E1165 Type 2 diabetes mellitus with hyperglycemia: Secondary | ICD-10-CM

## 2021-06-20 DIAGNOSIS — K9589 Other complications of other bariatric procedure: Secondary | ICD-10-CM

## 2021-06-20 DIAGNOSIS — E538 Deficiency of other specified B group vitamins: Secondary | ICD-10-CM

## 2021-06-20 NOTE — Telephone Encounter (Signed)
Please place future orders for lab appt.  

## 2021-06-23 ENCOUNTER — Other Ambulatory Visit: Payer: Self-pay

## 2021-06-23 ENCOUNTER — Other Ambulatory Visit (INDEPENDENT_AMBULATORY_CARE_PROVIDER_SITE_OTHER): Payer: 59

## 2021-06-23 DIAGNOSIS — K9589 Other complications of other bariatric procedure: Secondary | ICD-10-CM | POA: Diagnosis not present

## 2021-06-23 DIAGNOSIS — E538 Deficiency of other specified B group vitamins: Secondary | ICD-10-CM | POA: Diagnosis not present

## 2021-06-23 DIAGNOSIS — E1165 Type 2 diabetes mellitus with hyperglycemia: Secondary | ICD-10-CM | POA: Diagnosis not present

## 2021-06-23 DIAGNOSIS — E118 Type 2 diabetes mellitus with unspecified complications: Secondary | ICD-10-CM

## 2021-06-23 DIAGNOSIS — D508 Other iron deficiency anemias: Secondary | ICD-10-CM | POA: Diagnosis not present

## 2021-06-23 LAB — COMPREHENSIVE METABOLIC PANEL
ALT: 16 U/L (ref 0–53)
AST: 13 U/L (ref 0–37)
Albumin: 4 g/dL (ref 3.5–5.2)
Alkaline Phosphatase: 99 U/L (ref 39–117)
BUN: 12 mg/dL (ref 6–23)
CO2: 32 mEq/L (ref 19–32)
Calcium: 9.5 mg/dL (ref 8.4–10.5)
Chloride: 102 mEq/L (ref 96–112)
Creatinine, Ser: 0.88 mg/dL (ref 0.40–1.50)
GFR: 102.02 mL/min (ref >60.00)
Glucose, Bld: 95 mg/dL (ref 70–99)
Potassium: 4.2 mEq/L (ref 3.5–5.1)
Sodium: 139 mEq/L (ref 135–145)
Total Bilirubin: 0.4 mg/dL (ref 0.2–1.2)
Total Protein: 6.9 g/dL (ref 6.0–8.3)

## 2021-06-23 LAB — CBC WITH DIFFERENTIAL/PLATELET
Basophils Absolute: 0 10*3/uL (ref 0.0–0.1)
Basophils Relative: 0.3 % (ref 0.0–3.0)
Eosinophils Absolute: 0.2 10*3/uL (ref 0.0–0.7)
Eosinophils Relative: 2 % (ref 0.0–5.0)
HCT: 42.8 % (ref 39.0–52.0)
Hemoglobin: 13.8 g/dL (ref 13.0–17.0)
Lymphocytes Relative: 20 % (ref 12.0–46.0)
Lymphs Abs: 2 10*3/uL (ref 0.7–4.0)
MCHC: 32.2 g/dL (ref 30.0–36.0)
MCV: 85.3 fl (ref 78.0–100.0)
Monocytes Absolute: 0.9 10*3/uL (ref 0.1–1.0)
Monocytes Relative: 8.6 % (ref 3.0–12.0)
Neutro Abs: 6.9 10*3/uL (ref 1.4–7.7)
Neutrophils Relative %: 69.1 % (ref 43.0–77.0)
Platelets: 262 10*3/uL (ref 150.0–400.0)
RBC: 5.02 Mil/uL (ref 4.22–5.81)
RDW: 18.5 % — ABNORMAL HIGH (ref 11.5–15.5)
WBC: 10 10*3/uL (ref 4.0–10.5)

## 2021-06-23 LAB — LIPID PANEL
Cholesterol: 147 mg/dL (ref 0–200)
HDL: 39.1 mg/dL (ref >39.00)
LDL Cholesterol: 93 mg/dL (ref 0–99)
NonHDL: 107.46
Total CHOL/HDL Ratio: 4
Triglycerides: 72 mg/dL (ref 0.0–149.0)
VLDL: 14.4 mg/dL (ref 0.0–40.0)

## 2021-06-23 LAB — HEMOGLOBIN A1C: Hgb A1c MFr Bld: 6.8 % — ABNORMAL HIGH (ref 4.6–6.5)

## 2021-06-23 LAB — IBC + FERRITIN
Ferritin: 13.1 ng/mL — ABNORMAL LOW (ref 22.0–322.0)
Iron: 81 ug/dL (ref 42–165)
Saturation Ratios: 20 % (ref 20.0–50.0)
TIBC: 406 ug/dL (ref 250.0–450.0)
Transferrin: 290 mg/dL (ref 212.0–360.0)

## 2021-06-23 LAB — VITAMIN B12: Vitamin B-12: 274 pg/mL (ref 211–911)

## 2021-08-24 ENCOUNTER — Other Ambulatory Visit: Payer: Self-pay | Admitting: Family Medicine

## 2021-08-25 NOTE — Telephone Encounter (Signed)
Please advise 

## 2021-11-21 ENCOUNTER — Other Ambulatory Visit: Payer: Self-pay | Admitting: Family Medicine

## 2021-11-21 DIAGNOSIS — E538 Deficiency of other specified B group vitamins: Secondary | ICD-10-CM

## 2021-11-21 NOTE — Telephone Encounter (Signed)
Please call and schedule patient a follow up on diabetes with Dr. Ermalene Searing.  Once scheduled, please send back to me.

## 2021-11-22 NOTE — Telephone Encounter (Signed)
Last office visit 02/28/2021 (video) for DM/B12 Deficiency/Iron deficiency Anemia following bariatric surgery/recurrent depression/right hip pain/obesity.  AVS states to follow up in 3 months (05/30/21).  No future appointments.  Message left for patient to call and schedule office visit.  Patient did have labs done 06/23/21.  Refill??

## 2021-11-22 NOTE — Telephone Encounter (Signed)
Called patient on mobile number and left a vm.   Callback Number: [phone]

## 2021-12-22 ENCOUNTER — Ambulatory Visit: Payer: 59 | Admitting: Family Medicine

## 2022-01-04 ENCOUNTER — Ambulatory Visit: Payer: 59 | Admitting: Family Medicine

## 2022-01-04 ENCOUNTER — Encounter: Payer: Self-pay | Admitting: Family Medicine

## 2022-01-04 VITALS — BP 100/60 | HR 80 | Temp 98.4°F | Ht 65.75 in | Wt 246.1 lb

## 2022-01-04 DIAGNOSIS — K9589 Other complications of other bariatric procedure: Secondary | ICD-10-CM | POA: Diagnosis not present

## 2022-01-04 DIAGNOSIS — Z9884 Bariatric surgery status: Secondary | ICD-10-CM | POA: Diagnosis not present

## 2022-01-04 DIAGNOSIS — E118 Type 2 diabetes mellitus with unspecified complications: Secondary | ICD-10-CM

## 2022-01-04 DIAGNOSIS — E538 Deficiency of other specified B group vitamins: Secondary | ICD-10-CM

## 2022-01-04 DIAGNOSIS — E1165 Type 2 diabetes mellitus with hyperglycemia: Secondary | ICD-10-CM | POA: Diagnosis not present

## 2022-01-04 DIAGNOSIS — E119 Type 2 diabetes mellitus without complications: Secondary | ICD-10-CM

## 2022-01-04 DIAGNOSIS — D508 Other iron deficiency anemias: Secondary | ICD-10-CM

## 2022-01-04 DIAGNOSIS — Z6841 Body Mass Index (BMI) 40.0 and over, adult: Secondary | ICD-10-CM

## 2022-01-04 LAB — HM DIABETES FOOT EXAM

## 2022-01-04 LAB — POCT GLYCOSYLATED HEMOGLOBIN (HGB A1C): Hemoglobin A1C: 6.4 % — AB (ref 4.0–5.6)

## 2022-01-04 NOTE — Patient Instructions (Signed)
Keep up the great work!

## 2022-01-04 NOTE — Assessment & Plan Note (Signed)
Encouraged exercise, weight loss, healthy eating habits. ? ?

## 2022-01-04 NOTE — Assessment & Plan Note (Signed)
On supplementation.  Will reevaluate at upcoming physical in November.

## 2022-01-04 NOTE — Assessment & Plan Note (Signed)
We will reevaluate at upcoming physical in November.

## 2022-01-04 NOTE — Assessment & Plan Note (Signed)
Chronic, significant improvement on metformin 500 mg p.o. twice daily.  He is tolerating the medication well.  He is continue working on [employer], exercise and weight loss.

## 2022-01-04 NOTE — Progress Notes (Signed)
Patient ID: William Garza, male    DOB: [birth year], 46 y.o.   MRN: [id number]  This visit was conducted in person.  BP 100/60   Pulse 80   Temp 98.4 F (36.9 C) (Oral)   Ht 5' 5.75" (1.67 m)   Wt 246 lb 2 oz (111.6 kg)   SpO2 95%   BMI 40.03 kg/m    CC:  Chief Complaint  Patient presents with   Diabetes   Anemia    Subjective:   HPI: William Garza is a 46 y.o. male presenting on 01/04/2022 for Diabetes and Anemia  Last OV 02/23/22 He was started on metformin 500 mg p.o. twice daily given worsening diabetes control. In January 2023.  His hemoglobin A1c decreased to 6.8.  Diabetes: He continues to have excellent diabetes control on low-dose metformin.  He is working on weight loss and low carb diet.  He feels he has lost 15 lbs in the last year. Lab Results  Component Value Date   HGBA1C 6.4 (A) 01/04/2022  Using medications without difficulties: None Hypoglycemic episodes: no none episodes, except rarely if has to skip meals Hyperglycemic episodes:  Feet problems: no ulcers Blood Sugars averaging: not checking eye exam within last year:  yes, will obtain records Wt Readings from Last 3 Encounters:  01/04/22 246 lb 2 oz (111.6 kg)  12/08/19 243 lb 6.4 oz (110.4 kg)  04/03/19 244 lb 9 oz (110.9 kg)     He has a history of iron deficiency anemia following bariatric surgery. In January hemoglobin was 13.8 and iron was in normal range except for a low ferritin level.    We will recheck at CPX in 04/2022    Relevant past medical, surgical, family and social history reviewed and updated as indicated. Interim medical history since our last visit reviewed. Allergies and medications reviewed and updated. Outpatient Medications Prior to Visit  Medication Sig Dispense Refill   cyanocobalamin (,VITAMIN B-12,) 1000 MCG/ML injection Inject 1 mL (1,000 mcg total) into the muscle every 30 (thirty) days. 1 mL 0   metFORMIN (GLUCOPHAGE) 500 MG tablet TAKE 1 TABLET BY MOUTH   TWICE DAILY WITH MEAL 180 tablet 0   NEEDLE, DISP, 25 G (B-D DISP NEEDLE 25GX1") 25G X 1" MISC USE AS DIRECTED 3 each 0   sertraline (ZOLOFT) 100 MG tablet TAKE 1 AND 1/2 TABLETS BY  MOUTH AT BEDTIME 135 tablet 3   SYRINGE-NEEDLE, DISP, 3 ML (B-D 3CC LUER-LOK SYR 23GX1") 23G X 1" 3 ML MISC USE AS DIRECTED 3 each 0   No facility-administered medications prior to visit.     Per HPI unless specifically indicated in ROS section below Review of Systems  Constitutional:  Negative for fatigue and fever.  HENT:  Negative for ear pain.   Eyes:  Negative for pain.  Respiratory:  Negative for cough and shortness of breath.   Cardiovascular:  Negative for chest pain, palpitations and leg swelling.  Gastrointestinal:  Negative for abdominal pain.  Genitourinary:  Negative for dysuria.  Musculoskeletal:  Negative for arthralgias.  Neurological:  Negative for syncope, light-headedness and headaches.  Psychiatric/Behavioral:  Negative for dysphoric mood.    Objective:  BP 100/60   Pulse 80   Temp 98.4 F (36.9 C) (Oral)   Ht 5' 5.75" (1.67 m)   Wt 246 lb 2 oz (111.6 kg)   SpO2 95%   BMI 40.03 kg/m   Wt Readings from Last 3 Encounters:  01/04/22 246 lb 2 oz (111.6  kg)  12/08/19 243 lb 6.4 oz (110.4 kg)  04/03/19 244 lb 9 oz (110.9 kg)      Physical Exam Constitutional:      Appearance: He is well-developed. He is obese.  HENT:     Head: Normocephalic.     Right Ear: Hearing normal.     Left Ear: Hearing normal.     Nose: Nose normal.  Neck:     Thyroid: No thyroid mass or thyromegaly.     Vascular: No carotid bruit.     Trachea: Trachea normal.  Cardiovascular:     Rate and Rhythm: Normal rate and regular rhythm.     Pulses: Normal pulses.     Heart sounds: Heart sounds not distant. No murmur heard.    No friction rub. No gallop.     Comments: No peripheral edema Pulmonary:     Effort: Pulmonary effort is normal. No respiratory distress.     Breath sounds: Normal breath  sounds.  Skin:    General: Skin is warm and dry.     Findings: No rash.  Psychiatric:        Speech: Speech normal.        Behavior: Behavior normal.        Thought Content: Thought content normal.    Diabetic foot exam: Normal inspection No skin breakdown No calluses  Normal DP pulses Normal sensation to light touch and monofilament Nails normal     Results for orders placed or performed in visit on 01/04/22  POCT glycosylated hemoglobin (Hb A1C)  Result Value Ref Range   Hemoglobin A1C 6.4 (A) 4.0 - 5.6 %   HbA1c POC (<> result, manual entry)     HbA1c, POC (prediabetic range)     HbA1c, POC (controlled diabetic range)       COVID 19 screen:  No recent travel or known exposure to COVID19 The patient denies respiratory symptoms of COVID 19 at this time. The importance of social distancing was discussed today.   Assessment and Plan    Problem List Items Addressed This Visit     B12 deficiency    On supplementation.  Will reevaluate at upcoming physical in November.      Relevant Orders   Vitamin B12   Class 3 severe obesity with serious comorbidity and body mass index (BMI) of 40.0 to 44.9 in adult [facility])    Encouraged exercise, weight loss, healthy eating habits.       Diabetes mellitus type II, controlled, with no complications (HCC)    Chronic, significant improvement on metformin 500 mg p.o. twice daily.  He is tolerating the medication well.  He is continue working on [employer], exercise and weight loss.      Iron deficiency anemia following bariatric surgery    We will reevaluate at upcoming physical in November.      Relevant Orders   CBC with Differential/Platelet   IBC + Ferritin   S/P bariatric surgery   Other Visit Diagnoses     Poorly controlled type 2 diabetes mellitus with complication (HCC)    -  Primary   Relevant Orders   POCT glycosylated hemoglobin (Hb A1C) (Completed)   Hemoglobin A1c   Lipid panel   Comprehensive metabolic  panel   Microalbumin / creatinine urine ratio        Kerby Nora, MD

## 2022-03-07 ENCOUNTER — Encounter: Payer: Self-pay | Admitting: Family Medicine

## 2022-03-07 ENCOUNTER — Ambulatory Visit (INDEPENDENT_AMBULATORY_CARE_PROVIDER_SITE_OTHER): Payer: 59 | Admitting: Family Medicine

## 2022-03-07 VITALS — BP 118/70 | HR 61 | Temp 97.7°F | Ht 65.75 in | Wt 242.0 lb

## 2022-03-07 DIAGNOSIS — E119 Type 2 diabetes mellitus without complications: Secondary | ICD-10-CM

## 2022-03-07 DIAGNOSIS — Z0000 Encounter for general adult medical examination without abnormal findings: Secondary | ICD-10-CM

## 2022-03-07 DIAGNOSIS — Z6841 Body Mass Index (BMI) 40.0 and over, adult: Secondary | ICD-10-CM

## 2022-03-07 DIAGNOSIS — Z1211 Encounter for screening for malignant neoplasm of colon: Secondary | ICD-10-CM | POA: Diagnosis not present

## 2022-03-07 MED ORDER — OZEMPIC (0.25 OR 0.5 MG/DOSE) 2 MG/3ML ~~LOC~~ SOPN: 0.2500 mg | PEN_INJECTOR | SUBCUTANEOUS | 11 refills | Status: DC

## 2022-03-07 NOTE — Assessment & Plan Note (Signed)
Chronic, well controlled on metformin 500 mg p.o. twice daily.  He has been working aggressively on exercise and low-carb diet.  He is interested in starting Ozempic.  We discussed this medication in detail, complications as well as course of treatment. He will start Ozempic 0.25 mg weekly and then titrate up to 0.5 mg weekly after 2 weeks if tolerating. He will return in 3 months for reevaluation of A1c

## 2022-03-07 NOTE — Progress Notes (Signed)
Patient ID: William Garza, male    DOB: [birth year], 46 y.o.   MRN: [id number]  This visit was conducted in person.  BP 118/70 (BP Location: Left Arm, Patient Position: Sitting, Cuff Size: Large)   Pulse 61   Temp 97.7 F (36.5 C) (Temporal)   Ht 5' 5.75" (1.67 m)   Wt 242 lb (109.8 kg)   SpO2 96%   BMI 39.36 kg/m    CC:  Chief Complaint  Patient presents with   Annual Exam    Subjective:   HPI: William Garza is a 46 y.o. male presenting on 03/07/2022 for Annual Exam   MDD Well controlled on sertraline 100 mg daily   Diabetes: Well-controlled on metformin 500 mg p.o. twice daily at last check. He is interested in  ozempic for DM control and weight loss. Lab Results  Component Value Date   HGBA1C 6.4 (A) 01/04/2022  Using medications without difficulties: Hypoglycemic episodes: Hyperglycemic episodes: Feet problems: no ulcers Blood Sugars averaging: not checking. eye exam within last year: Wt Readings from Last 3 Encounters:  03/07/22 242 lb (109.8 kg)  01/04/22 246 lb 2 oz (111.6 kg)  12/08/19 243 lb 6.4 oz (110.4 kg)   Due for re-eval of iron, anemia, cholesterol etc.  S/P bariatric surgery. Wt Readings from Last 3 Encounters:  03/07/22 242 lb (109.8 kg)  01/04/22 246 lb 2 oz (111.6 kg)  12/08/19 243 lb 6.4 oz (110.4 kg)  Body mass index is 39.36 kg/m.      Relevant past medical, surgical, family and social history reviewed and updated as indicated. Interim medical history since our last visit reviewed. Allergies and medications reviewed and updated. Outpatient Medications Prior to Visit  Medication Sig Dispense Refill   cyanocobalamin (,VITAMIN B-12,) 1000 MCG/ML injection Inject 1 mL (1,000 mcg total) into the muscle every 30 (thirty) days. 1 mL 0   metFORMIN (GLUCOPHAGE) 500 MG tablet TAKE 1 TABLET BY MOUTH  TWICE DAILY WITH MEAL 180 tablet 0   NEEDLE, DISP, 25 G (B-D DISP NEEDLE 25GX1") 25G X 1" MISC USE AS DIRECTED 3 each 0   sertraline  (ZOLOFT) 100 MG tablet TAKE 1 AND 1/2 TABLETS BY  MOUTH AT BEDTIME 135 tablet 3   SYRINGE-NEEDLE, DISP, 3 ML (B-D 3CC LUER-LOK SYR 23GX1") 23G X 1" 3 ML MISC USE AS DIRECTED 3 each 0   No facility-administered medications prior to visit.     Per HPI unless specifically indicated in ROS section below Review of Systems Objective:  BP 118/70 (BP Location: Left Arm, Patient Position: Sitting, Cuff Size: Large)   Pulse 61   Temp 97.7 F (36.5 C) (Temporal)   Ht 5' 5.75" (1.67 m)   Wt 242 lb (109.8 kg)   SpO2 96%   BMI 39.36 kg/m   Wt Readings from Last 3 Encounters:  03/07/22 242 lb (109.8 kg)  01/04/22 246 lb 2 oz (111.6 kg)  12/08/19 243 lb 6.4 oz (110.4 kg)      Physical Exam    Results for orders placed or performed in visit on 01/04/22  POCT glycosylated hemoglobin (Hb A1C)  Result Value Ref Range   Hemoglobin A1C 6.4 (A) 4.0 - 5.6 %   HbA1c POC (<> result, manual entry)     HbA1c, POC (prediabetic range)     HbA1c, POC (controlled diabetic range)    HM DIABETES FOOT EXAM  Result Value Ref Range   HM Diabetic Foot Exam done      COVID  19 screen:  No recent travel or known exposure to COVID19 The patient denies respiratory symptoms of COVID 19 at this time. The importance of social distancing was discussed today.   Assessment and Plan   The patient's preventative maintenance and recommended screening tests for an annual wellness exam were reviewed in full today. Brought up to date unless services declined.  Counselled on the importance of diet, exercise, and its role in overall health and mortality. The patient's FH and SH was reviewed, including their home life, tobacco status, and drug and alcohol status.    Vaccines: Due for flu (will do at work), tetanus and COVID19 Prostate Cancer Screen: no early family history  Colon Cancer Screen:  due      Smoking Status: none ETOH/ drug use:  beer 2-3 cans per week/none  Hep C:  done  HIV screen:    refused  Problem List Items Addressed This Visit     Class 3 severe obesity with serious comorbidity and body mass index (BMI) of 40.0 to 44.9 in adult [facility])   Relevant Medications   Semaglutide,0.25 or 0.5MG /DOS, (OZEMPIC, 0.25 OR 0.5 MG/DOSE,) 2 MG/3ML SOPN   Diabetes mellitus type II, controlled, with no complications (HCC)    Chronic, well controlled on metformin 500 mg p.o. twice daily.  He has been working aggressively on exercise and low-carb diet.  He is interested in starting Ozempic.  We discussed this medication in detail, complications as well as course of treatment. He will start Ozempic 0.25 mg weekly and then titrate up to 0.5 mg weekly after 2 weeks if tolerating. He will return in 3 months for reevaluation of A1c      Relevant Medications   Semaglutide,0.25 or 0.5MG /DOS, (OZEMPIC, 0.25 OR 0.5 MG/DOSE,) 2 MG/3ML SOPN   Other Visit Diagnoses     Routine general medical examination at a health care facility    -  Primary   Colon cancer screening       Relevant Orders   Cologuard        Eliezer Lofts, MD

## 2022-03-07 NOTE — Patient Instructions (Addendum)
Get flu and tetanus at work.  Return Cologuard as discussed. Start Ozempic 0.25 mg weekly.  Work on increased exercise and heart healthy low carb diet.

## 2022-03-08 ENCOUNTER — Encounter: Payer: Self-pay | Admitting: Family Medicine

## 2022-03-08 MED ORDER — OZEMPIC (0.25 OR 0.5 MG/DOSE) 2 MG/3ML ~~LOC~~ SOPN: 0.2500 mg | PEN_INJECTOR | SUBCUTANEOUS | 11 refills | Status: DC

## 2022-03-28 ENCOUNTER — Other Ambulatory Visit: Payer: Self-pay | Admitting: Family Medicine

## 2022-03-30 LAB — COLOGUARD: COLOGUARD: NEGATIVE

## 2022-04-04 ENCOUNTER — Other Ambulatory Visit (INDEPENDENT_AMBULATORY_CARE_PROVIDER_SITE_OTHER): Payer: 59

## 2022-04-04 DIAGNOSIS — E1165 Type 2 diabetes mellitus with hyperglycemia: Secondary | ICD-10-CM

## 2022-04-04 DIAGNOSIS — E538 Deficiency of other specified B group vitamins: Secondary | ICD-10-CM

## 2022-04-04 DIAGNOSIS — D508 Other iron deficiency anemias: Secondary | ICD-10-CM | POA: Diagnosis not present

## 2022-04-04 DIAGNOSIS — E118 Type 2 diabetes mellitus with unspecified complications: Secondary | ICD-10-CM | POA: Diagnosis not present

## 2022-04-04 DIAGNOSIS — K9589 Other complications of other bariatric procedure: Secondary | ICD-10-CM | POA: Diagnosis not present

## 2022-04-04 LAB — MICROALBUMIN / CREATININE URINE RATIO
Creatinine,U: 122.9 mg/dL
Microalb Creat Ratio: 0.6 mg/g (ref 0.0–30.0)
Microalb, Ur: <0.7 mg/dL (ref 0.0–1.9)

## 2022-04-04 LAB — COMPREHENSIVE METABOLIC PANEL
ALT: 15 U/L (ref 0–53)
AST: 12 U/L (ref 0–37)
Albumin: 4.3 g/dL (ref 3.5–5.2)
Alkaline Phosphatase: 84 U/L (ref 39–117)
BUN: 12 mg/dL (ref 6–23)
CO2: 31 mEq/L (ref 19–32)
Calcium: 9.9 mg/dL (ref 8.4–10.5)
Chloride: 100 mEq/L (ref 96–112)
Creatinine, Ser: 0.89 mg/dL (ref 0.40–1.50)
GFR: 101.12 mL/min (ref >60.00)
Glucose, Bld: 94 mg/dL (ref 70–99)
Potassium: 4.6 mEq/L (ref 3.5–5.1)
Sodium: 139 mEq/L (ref 135–145)
Total Bilirubin: 0.5 mg/dL (ref 0.2–1.2)
Total Protein: 7.2 g/dL (ref 6.0–8.3)

## 2022-04-04 LAB — CBC WITH DIFFERENTIAL/PLATELET
Basophils Absolute: 0 10*3/uL (ref 0.0–0.1)
Basophils Relative: 0.3 % (ref 0.0–3.0)
Eosinophils Absolute: 0.1 10*3/uL (ref 0.0–0.7)
Eosinophils Relative: 1.6 % (ref 0.0–5.0)
HCT: 44.7 % (ref 39.0–52.0)
Hemoglobin: 14.9 g/dL (ref 13.0–17.0)
Lymphocytes Relative: 15.8 % (ref 12.0–46.0)
Lymphs Abs: 1.3 10*3/uL (ref 0.7–4.0)
MCHC: 33.4 g/dL (ref 30.0–36.0)
MCV: 92.9 fl (ref 78.0–100.0)
Monocytes Absolute: 0.6 10*3/uL (ref 0.1–1.0)
Monocytes Relative: 7.9 % (ref 3.0–12.0)
Neutro Abs: 6.1 10*3/uL (ref 1.4–7.7)
Neutrophils Relative %: 74.4 % (ref 43.0–77.0)
Platelets: 271 10*3/uL (ref 150.0–400.0)
RBC: 4.82 Mil/uL (ref 4.22–5.81)
RDW: 13.3 % (ref 11.5–15.5)
WBC: 8.2 10*3/uL (ref 4.0–10.5)

## 2022-04-04 LAB — HEMOGLOBIN A1C: Hgb A1c MFr Bld: 6.4 % (ref 4.6–6.5)

## 2022-04-04 LAB — LIPID PANEL
Cholesterol: 153 mg/dL (ref 0–200)
HDL: 34.7 mg/dL — ABNORMAL LOW (ref >39.00)
LDL Cholesterol: 102 mg/dL — ABNORMAL HIGH (ref 0–99)
NonHDL: 117.96
Total CHOL/HDL Ratio: 4
Triglycerides: 78 mg/dL (ref 0.0–149.0)
VLDL: 15.6 mg/dL (ref 0.0–40.0)

## 2022-04-04 LAB — IBC + FERRITIN
Ferritin: 37.8 ng/mL (ref 22.0–322.0)
Iron: 74 ug/dL (ref 42–165)
Saturation Ratios: 21.4 % (ref 20.0–50.0)
TIBC: 345.8 ug/dL (ref 250.0–450.0)
Transferrin: 247 mg/dL (ref 212.0–360.0)

## 2022-04-04 LAB — VITAMIN B12: Vitamin B-12: 174 pg/mL — ABNORMAL LOW (ref 211–911)

## 2022-04-05 ENCOUNTER — Other Ambulatory Visit: Payer: Self-pay | Admitting: Family Medicine

## 2022-04-05 ENCOUNTER — Encounter: Payer: Self-pay | Admitting: Family Medicine

## 2022-04-05 DIAGNOSIS — E538 Deficiency of other specified B group vitamins: Secondary | ICD-10-CM

## 2022-04-06 NOTE — Telephone Encounter (Signed)
Last office visit 03/07/22 for CPE.   Last B12 level 04/04/22 which was low at 174 pg/mL.  Ok to refill?

## 2022-04-09 MED ORDER — ATORVASTATIN CALCIUM 10 MG PO TABS: 10.0000 mg | ORAL_TABLET | Freq: Every day | ORAL | 3 refills | Status: DC

## 2022-04-10 MED ORDER — OZEMPIC (0.25 OR 0.5 MG/DOSE) 2 MG/3ML ~~LOC~~ SOPN: 0.2500 mg | PEN_INJECTOR | SUBCUTANEOUS | 3 refills | Status: DC

## 2022-04-10 NOTE — Addendum Note (Signed)
Addended by: Carter Kitten on: 04/10/2022 09:12 AM   Modules accepted: Orders

## 2022-04-12 ENCOUNTER — Encounter: Payer: 59 | Admitting: Family Medicine

## 2022-05-15 ENCOUNTER — Encounter: Payer: Self-pay | Admitting: Family Medicine

## 2022-05-15 MED ORDER — SEMAGLUTIDE (1 MG/DOSE) 4 MG/3ML ~~LOC~~ SOPN: 1.0000 mg | PEN_INJECTOR | SUBCUTANEOUS | 11 refills | Status: DC

## 2022-05-16 ENCOUNTER — Other Ambulatory Visit: Payer: Self-pay | Admitting: Family Medicine

## 2022-06-16 ENCOUNTER — Other Ambulatory Visit: Payer: Self-pay | Admitting: Family Medicine

## 2022-11-29 ENCOUNTER — Other Ambulatory Visit: Payer: Self-pay | Admitting: Family Medicine

## 2023-01-10 ENCOUNTER — Ambulatory Visit: Payer: 59 | Admitting: Family Medicine

## 2023-01-10 ENCOUNTER — Encounter: Payer: Self-pay | Admitting: Family Medicine

## 2023-01-10 VITALS — BP 116/70 | HR 83 | Temp 97.2°F | Ht 65.75 in | Wt 178.1 lb

## 2023-01-10 DIAGNOSIS — R350 Frequency of micturition: Secondary | ICD-10-CM | POA: Insufficient documentation

## 2023-01-10 LAB — POCT UA - MICROSCOPIC ONLY

## 2023-01-10 LAB — POC URINALSYSI DIPSTICK (AUTOMATED)
Blood, UA: NEGATIVE
Glucose, UA: NEGATIVE
Nitrite, UA: NEGATIVE
Protein, UA: POSITIVE — AB
Spec Grav, UA: 1.025 (ref 1.010–1.025)
Urobilinogen, UA: 0.2 E.U./dL
pH, UA: 5.5 (ref 5.0–8.0)

## 2023-01-10 NOTE — Assessment & Plan Note (Addendum)
Acute, possible partially treated urinary tract infection given few white blood cells on urine sample.  Will have him increase water and send urine for culture.  Complete amoxicillin course for sinus infection, now improving  If urine culture negative and symptoms not resolved consider prostatitis or prostate source of symptoms.  Return and ER precautions provided

## 2023-01-10 NOTE — Progress Notes (Signed)
Patient ID: William Garza, male    DOB: [birth year], 47 y.o.   MRN: [id number]  This visit was conducted in person.  BP 116/70   Pulse 83   Temp (!) 97.2 F (36.2 C) (Temporal)   Ht 5' 5.75" (1.67 m)   Wt 178 lb 2 oz (80.8 kg)   SpO2 98%   BMI 28.97 kg/m    CC:  Chief Complaint  Patient presents with   Urinary Frequency    C/o urinary frequency, mild discomfort during urination, trouble starting stream and feeling bladder not fully emptying. Sxs started 01/03/23. Had sinus infection recently, taking amoxicillin.     Subjective:   HPI: Adric Revier is a 47 y.o. male presenting on 01/10/2023 for Urinary Frequency (C/o urinary frequency, mild discomfort during urination, trouble starting stream and feeling bladder not fully emptying. Sxs started 01/03/23. Had sinus infection recently, taking amoxicillin. )  New onset urinary frequency, mild dysuria and trouble with starting urinary stream.  Bladder feels full and not emptying.  Ongoing for the last 7 days. Urinary Frequency  This is a new problem. The current episode started in the past 7 days. The problem has been gradually improving. The quality of the pain is described as aching. The pain is mild. Maximum temperature: subjective  inittially... woke in sweats... none in last 2 days, also had ongoing sinus infeciton. He is Not sexually active. There is No history of pyelonephritis. Associated symptoms include chills, frequency and hesitancy. Pertinent negatives include no flank pain. He has tried antibiotics and increased fluids for the symptoms. The treatment provided moderate relief. There is no history of catheterization, kidney stones, recurrent UTIs, a single kidney, urinary stasis or a urological procedure.    No abdominal Pain.   Recent antibiotics..  Amoxicillin in the last week for sinus infection. Took last dose of  7 day course.      Relevant past medical, surgical, family and social history reviewed and updated as  indicated. Interim medical history since our last visit reviewed. Allergies and medications reviewed and updated. Outpatient Medications Prior to Visit  Medication Sig Dispense Refill   amoxicillin-clavulanate (AUGMENTIN) 875-125 MG tablet Take 1 tablet by mouth 2 (two) times daily.     atorvastatin (LIPITOR) 10 MG tablet Take 1 tablet (10 mg total) by mouth daily. 90 tablet 3   B-D 3CC LUER-LOK SYR 23GX1" 23G X 1" 3 ML MISC USE AS DIRECTED 3 each 3   B-D DISP NEEDLE 25GX1" 25G X 1" MISC USE AS DIRECTED 3 each 3   cyanocobalamin (VITAMIN B12) 1000 MCG/ML injection INJECT INTRAMUSCULARLY 1 ML  EVERY 30 DAYS (DISCARD 28 DAYS  AFTER FIRST USE) 3 mL 3   metFORMIN (GLUCOPHAGE) 500 MG tablet TAKE 1 TABLET BY MOUTH TWICE  DAILY WITH MEALS 180 tablet 3   Semaglutide, 1 MG/DOSE, 4 MG/3ML SOPN Inject 1 mg as directed once a week. 3 mL 11   sertraline (ZOLOFT) 100 MG tablet TAKE 1 AND 1/2 TABLETS BY MOUTH  AT BEDTIME 135 tablet 0   No facility-administered medications prior to visit.     Per HPI unless specifically indicated in ROS section below Review of Systems  Constitutional:  Positive for chills. Negative for fatigue and fever.  HENT:  Negative for ear pain.   Eyes:  Negative for pain.  Respiratory:  Negative for cough and shortness of breath.   Cardiovascular:  Negative for chest pain, palpitations and leg swelling.  Gastrointestinal:  Negative for abdominal pain.  Genitourinary:  Positive for frequency and hesitancy. Negative for dysuria and flank pain.  Musculoskeletal:  Negative for arthralgias.  Neurological:  Negative for syncope, light-headedness and headaches.  Psychiatric/Behavioral:  Negative for dysphoric mood.    Objective:  BP 116/70   Pulse 83   Temp (!) 97.2 F (36.2 C) (Temporal)   Ht 5' 5.75" (1.67 m)   Wt 178 lb 2 oz (80.8 kg)   SpO2 98%   BMI 28.97 kg/m   Wt Readings from Last 3 Encounters:  01/10/23 178 lb 2 oz (80.8 kg)  03/07/22 242 lb (109.8 kg)  01/04/22  246 lb 2 oz (111.6 kg)      Physical Exam Constitutional:      Appearance: He is well-developed.  HENT:     Head: Normocephalic.     Right Ear: Hearing normal.     Left Ear: Hearing normal.     Nose: Nose normal.  Neck:     Thyroid: No thyroid mass or thyromegaly.     Vascular: No carotid bruit.     Trachea: Trachea normal.  Cardiovascular:     Rate and Rhythm: Normal rate and regular rhythm.     Pulses: Normal pulses.     Heart sounds: Heart sounds not distant. No murmur heard.    No friction rub. No gallop.     Comments: No peripheral edema Pulmonary:     Effort: Pulmonary effort is normal. No respiratory distress.     Breath sounds: Normal breath sounds.  Skin:    General: Skin is warm and dry.     Findings: No rash.  Psychiatric:        Speech: Speech normal.        Behavior: Behavior normal.        Thought Content: Thought content normal.       Results for orders placed or performed in visit on 01/10/23  POCT Urinalysis Dipstick (Automated)  Result Value Ref Range   Color, UA dark yellow    Clarity, UA clear    Glucose, UA Negative Negative   Bilirubin, UA 2+    Ketones, UA +/-    Spec Grav, UA 1.025 1.010 - 1.025   Blood, UA negative    pH, UA 5.5 5.0 - 8.0   Protein, UA Positive (A) Negative   Urobilinogen, UA 0.2 0.2 or 1.0 E.U./dL   Nitrite, UA negative    Leukocytes, UA Trace (A) Negative    Assessment and Plan  Urinary frequency Assessment & Plan: Acute, possible partially treated urinary tract infection given few white blood cells on urine sample.  Will have him increase water and send urine for culture.  Complete amoxicillin course for sinus infection, now improving  If urine culture negative and symptoms not resolved consider prostatitis or prostate source of symptoms.  Return and ER precautions provided  Orders: -     POCT Urinalysis Dipstick (Automated) -     Urine Culture    No follow-ups on file.   Kerby Nora, MD

## 2023-01-22 ENCOUNTER — Other Ambulatory Visit: Payer: Self-pay | Admitting: Family Medicine

## 2023-01-23 NOTE — Telephone Encounter (Signed)
Please schedule CPE with fasting labs prior for sometime after 03/08/23.

## 2023-01-23 NOTE — Telephone Encounter (Signed)
Couldn't leave vm, sent mychart message

## 2023-02-01 ENCOUNTER — Encounter: Payer: Self-pay | Admitting: Family Medicine

## 2023-02-01 ENCOUNTER — Ambulatory Visit: Payer: 59 | Admitting: Family Medicine

## 2023-02-01 VITALS — BP 104/66 | HR 99 | Temp 99.1°F | Ht 65.75 in | Wt 177.4 lb

## 2023-02-01 DIAGNOSIS — R109 Unspecified abdominal pain: Secondary | ICD-10-CM

## 2023-02-01 DIAGNOSIS — E119 Type 2 diabetes mellitus without complications: Secondary | ICD-10-CM

## 2023-02-01 DIAGNOSIS — Z7985 Long-term (current) use of injectable non-insulin antidiabetic drugs: Secondary | ICD-10-CM | POA: Diagnosis not present

## 2023-02-01 DIAGNOSIS — N10 Acute tubulo-interstitial nephritis: Secondary | ICD-10-CM

## 2023-02-01 LAB — POC URINALSYSI DIPSTICK (AUTOMATED)
Bilirubin, UA: NEGATIVE
Glucose, UA: POSITIVE — AB
Nitrite, UA: POSITIVE
Protein, UA: POSITIVE — AB
Spec Grav, UA: 1.015 (ref 1.010–1.025)
Urobilinogen, UA: 0.2 E.U./dL
pH, UA: 6 (ref 5.0–8.0)

## 2023-02-01 LAB — POCT GLYCOSYLATED HEMOGLOBIN (HGB A1C): Hemoglobin A1C: 5.6 % (ref 4.0–5.6)

## 2023-02-01 MED ORDER — CIPROFLOXACIN HCL 500 MG PO TABS: 500.0000 mg | ORAL_TABLET | Freq: Two times a day (BID) | ORAL | 0 refills | Status: AC

## 2023-02-01 NOTE — Assessment & Plan Note (Signed)
Acute worsening urinary symptoms along with right flank pain and low-grade temperature/chills. Concern for right pyelonephritis. Urinalysis and micro positive for white blood cells. Will treat with Cipro 500 mg twice daily x 7 days.  Send urine culture. Push fluids . Return and ER precautions provided

## 2023-02-01 NOTE — Progress Notes (Signed)
Patient ID: William Garza, male    DOB: Sep 28, 1975, 47 y.o.   MRN: [id number]  This visit was conducted in person.  BP 104/66 (BP Location: Right Arm, Patient Position: Sitting, Cuff Size: Normal)   Pulse 99   Temp 99.1 F (37.3 C) (Temporal)   Ht 5' 5.75" (1.67 m)   Wt 177 lb 6 oz (80.5 kg)   SpO2 97%   BMI 28.85 kg/m    CC:  Chief Complaint  Patient presents with   Chills    Off and On x 2 weeks   Night Sweats   Dry Mouth   Flank Pain    Right    Subjective:   HPI: William Garza is a 47 y.o. male presenting on 02/01/2023 for Chills (Off and On x 2 weeks), Night Sweats, Dry Mouth, and Flank Pain (Right)   Treated in early August with amox for sinus infection.  Had urinary tract symptoms.. felt possible  partially treated UTI.  Having intermittent night sweats and chills in last  2 week.Trey Paula woirsening.  No fever. Still increased urinary frequency... nocturia. No dysuria, no flow issues.  No perineal pain.   In last week right flank dull ache.  Constant dry mouth... so drinking more water.  No abdominal pain, No N/V. NO D/C.  No blood in urine.    Hx of DM   S/P bariatric surgery. Lab Results  Component Value Date   HGBA1C 5.6 02/01/2023   Wt Readings from Last 3 Encounters:  02/01/23 177 lb 6 oz (80.5 kg)  01/10/23 178 lb 2 oz (80.8 kg)  03/07/22 242 lb (109.8 kg)      Relevant past medical, surgical, family and social history reviewed and updated as indicated. Interim medical history since our last visit reviewed. Allergies and medications reviewed and updated. Outpatient Medications Prior to Visit  Medication Sig Dispense Refill   atorvastatin (LIPITOR) 10 MG tablet TAKE 1 TABLET BY MOUTH DAILY 90 tablet 0   B-D 3CC LUER-LOK SYR 23GX1" 23G X 1" 3 ML MISC USE AS DIRECTED 3 each 3   B-D DISP NEEDLE 25GX1" 25G X 1" MISC USE AS DIRECTED 3 each 3   cyanocobalamin (VITAMIN B12) 1000 MCG/ML injection INJECT INTRAMUSCULARLY 1 ML  EVERY 30 DAYS  (DISCARD 28 DAYS  AFTER FIRST USE) 3 mL 3   metFORMIN (GLUCOPHAGE) 500 MG tablet TAKE 1 TABLET BY MOUTH TWICE  DAILY WITH MEALS 180 tablet 3   Semaglutide, 1 MG/DOSE, 4 MG/3ML SOPN Inject 1 mg as directed once a week. 3 mL 11   sertraline (ZOLOFT) 100 MG tablet TAKE 1 AND 1/2 TABLETS BY MOUTH  AT BEDTIME 135 tablet 0   amoxicillin-clavulanate (AUGMENTIN) 875-125 MG tablet Take 1 tablet by mouth 2 (two) times daily.     No facility-administered medications prior to visit.     Per HPI unless specifically indicated in ROS section below Review of Systems  Constitutional:  Positive for chills. Negative for fatigue and fever.  HENT:  Negative for ear pain.   Eyes:  Negative for pain.  Respiratory:  Negative for cough and shortness of breath.   Cardiovascular:  Negative for chest pain, palpitations and leg swelling.  Gastrointestinal:  Negative for abdominal pain.  Genitourinary:  Positive for flank pain and frequency. Negative for dysuria, hematuria, penile discharge, penile pain and urgency.  Musculoskeletal:  Negative for arthralgias.  Neurological:  Negative for syncope, light-headedness and headaches.  Psychiatric/Behavioral:  Negative for dysphoric mood.  Objective:  BP 104/66 (BP Location: Right Arm, Patient Position: Sitting, Cuff Size: Normal)   Pulse 99   Temp 99.1 F (37.3 C) (Temporal)   Ht 5' 5.75" (1.67 m)   Wt 177 lb 6 oz (80.5 kg)   SpO2 97%   BMI 28.85 kg/m   Wt Readings from Last 3 Encounters:  02/01/23 177 lb 6 oz (80.5 kg)  01/10/23 178 lb 2 oz (80.8 kg)  03/07/22 242 lb (109.8 kg)      Physical Exam Constitutional:      Appearance: He is well-developed.  HENT:     Head: Normocephalic.     Right Ear: Hearing normal.     Left Ear: Hearing normal.     Nose: Nose normal.  Neck:     Thyroid: No thyroid mass or thyromegaly.     Vascular: No carotid bruit.     Trachea: Trachea normal.  Cardiovascular:     Rate and Rhythm: Normal rate and regular rhythm.      Pulses: Normal pulses.     Heart sounds: Heart sounds not distant. No murmur heard.    No friction rub. No gallop.     Comments: No peripheral edema Pulmonary:     Effort: Pulmonary effort is normal. No respiratory distress.     Breath sounds: Normal breath sounds.  Abdominal:     General: Bowel sounds are normal. There is no distension.     Palpations: There is no mass.     Tenderness: There is no abdominal tenderness. There is right CVA tenderness. There is no left CVA tenderness, guarding or rebound.  Skin:    General: Skin is warm and dry.     Findings: No rash.  Psychiatric:        Speech: Speech normal.        Behavior: Behavior normal.        Thought Content: Thought content normal.       Results for orders placed or performed in visit on 02/01/23  POCT Urinalysis Dipstick (Automated)  Result Value Ref Range   Color, UA Yellow    Clarity, UA Cloudy    Glucose, UA Positive (A) Negative   Bilirubin, UA Negative    Ketones, UA Trace    Spec Grav, UA 1.015 1.010 - 1.025   Blood, UA Trace    pH, UA 6.0 5.0 - 8.0   Protein, UA Positive (A) Negative   Urobilinogen, UA 0.2 0.2 or 1.0 E.U./dL   Nitrite, UA Positive    Leukocytes, UA Large (3+) (A) Negative  POCT glycosylated hemoglobin (Hb A1C)  Result Value Ref Range   Hemoglobin A1C 5.6 4.0 - 5.6 %   HbA1c POC (<> result, manual entry)     HbA1c, POC (prediabetic range)     HbA1c, POC (controlled diabetic range)      Assessment and Plan  Acute pyelonephritis Assessment & Plan: Acute worsening urinary symptoms along with right flank pain and low-grade temperature/chills. Concern for right pyelonephritis. Urinalysis and micro positive for white blood cells. Will treat with Cipro 500 mg twice daily x 7 days.  Send urine culture. Push fluids . Return and ER precautions provided    Right flank pain -     POCT Urinalysis Dipstick (Automated) -     Urine Culture  Controlled type 2 diabetes mellitus without  complication, without long-term current use of insulin (HCC) Assessment & Plan: Chronic, excellent control and almost entirely resolved with dramatic weight loss on Ozempic.  Orders: -     POCT glycosylated hemoglobin (Hb A1C)  Other orders -     Ciprofloxacin HCl; Take 1 tablet (500 mg total) by mouth 2 (two) times daily for 7 days.  Dispense: 14 tablet; Refill: 0    No follow-ups on file.   Kerby Nora, MD

## 2023-02-01 NOTE — Patient Instructions (Signed)
We will call with urine culture result. Continue to push fluids.   Complete course of Cipro x 7 days.   If fever on antibiotics or unable to keep down antibiotics due to vomiting go to emergency room for consideration of IV treatment.

## 2023-02-01 NOTE — Assessment & Plan Note (Signed)
Chronic, excellent control and almost entirely resolved with dramatic weight loss on Ozempic.

## 2023-02-03 ENCOUNTER — Other Ambulatory Visit: Payer: Self-pay | Admitting: Family Medicine

## 2023-02-03 DIAGNOSIS — E538 Deficiency of other specified B group vitamins: Secondary | ICD-10-CM

## 2023-02-03 LAB — URINE CULTURE
MICRO NUMBER:: 15405744
SPECIMEN QUALITY:: ADEQUATE

## 2023-02-20 ENCOUNTER — Telehealth: Payer: Self-pay | Admitting: *Deleted

## 2023-02-20 DIAGNOSIS — Z114 Encounter for screening for human immunodeficiency virus [HIV]: Secondary | ICD-10-CM

## 2023-02-20 DIAGNOSIS — E538 Deficiency of other specified B group vitamins: Secondary | ICD-10-CM

## 2023-02-20 DIAGNOSIS — K9589 Other complications of other bariatric procedure: Secondary | ICD-10-CM

## 2023-02-20 DIAGNOSIS — E119 Type 2 diabetes mellitus without complications: Secondary | ICD-10-CM

## 2023-02-20 NOTE — Telephone Encounter (Signed)
-----   Message from Alvina Chou sent at 02/19/2023  3:07 PM EDT ----- Regarding: Lab orders for Tu, 10.2.24 Patient is scheduled for CPX labs, please order future labs, Thanks , Camelia Eng

## 2023-03-06 ENCOUNTER — Other Ambulatory Visit: Payer: 59

## 2023-03-07 ENCOUNTER — Other Ambulatory Visit: Payer: 59

## 2023-03-07 DIAGNOSIS — D508 Other iron deficiency anemias: Secondary | ICD-10-CM

## 2023-03-07 DIAGNOSIS — E119 Type 2 diabetes mellitus without complications: Secondary | ICD-10-CM | POA: Diagnosis not present

## 2023-03-07 DIAGNOSIS — E538 Deficiency of other specified B group vitamins: Secondary | ICD-10-CM | POA: Diagnosis not present

## 2023-03-07 DIAGNOSIS — K9589 Other complications of other bariatric procedure: Secondary | ICD-10-CM

## 2023-03-07 LAB — COMPREHENSIVE METABOLIC PANEL
ALT: 26 U/L (ref 0–53)
AST: 19 U/L (ref 0–37)
Albumin: 4.2 g/dL (ref 3.5–5.2)
Alkaline Phosphatase: 72 U/L (ref 39–117)
BUN: 11 mg/dL (ref 6–23)
CO2: 30 meq/L (ref 19–32)
Calcium: 10.1 mg/dL (ref 8.4–10.5)
Chloride: 102 meq/L (ref 96–112)
Creatinine, Ser: 0.95 mg/dL (ref 0.40–1.50)
GFR: 93.84 mL/min (ref >60.00)
Glucose, Bld: 85 mg/dL (ref 70–99)
Potassium: 4.4 meq/L (ref 3.5–5.1)
Sodium: 139 meq/L (ref 135–145)
Total Bilirubin: 0.7 mg/dL (ref 0.2–1.2)
Total Protein: 6.9 g/dL (ref 6.0–8.3)

## 2023-03-07 LAB — LIPID PANEL
Cholesterol: 109 mg/dL (ref 0–200)
HDL: 47.4 mg/dL (ref >39.00)
LDL Cholesterol: 51 mg/dL (ref 0–99)
NonHDL: 61.12
Total CHOL/HDL Ratio: 2
Triglycerides: 50 mg/dL (ref 0.0–149.0)
VLDL: 10 mg/dL (ref 0.0–40.0)

## 2023-03-07 LAB — MICROALBUMIN / CREATININE URINE RATIO
Creatinine,U: 126.7 mg/dL
Microalb Creat Ratio: 0.8 mg/g (ref 0.0–30.0)
Microalb, Ur: 1 mg/dL (ref 0.0–1.9)

## 2023-03-07 LAB — IBC + FERRITIN
Ferritin: 79.9 ng/mL (ref 22.0–322.0)
Iron: 140 ug/dL (ref 42–165)
Saturation Ratios: 41.3 % (ref 20.0–50.0)
TIBC: 338.8 ug/dL (ref 250.0–450.0)
Transferrin: 242 mg/dL (ref 212.0–360.0)

## 2023-03-07 LAB — CBC WITH DIFFERENTIAL/PLATELET
Basophils Absolute: 0 10*3/uL (ref 0.0–0.1)
Basophils Relative: 0.6 % (ref 0.0–3.0)
Eosinophils Absolute: 0.1 10*3/uL (ref 0.0–0.7)
Eosinophils Relative: 1.2 % (ref 0.0–5.0)
HCT: 43.3 % (ref 39.0–52.0)
Hemoglobin: 14.1 g/dL (ref 13.0–17.0)
Lymphocytes Relative: 20.5 % (ref 12.0–46.0)
Lymphs Abs: 1.3 10*3/uL (ref 0.7–4.0)
MCHC: 32.6 g/dL (ref 30.0–36.0)
MCV: 97.9 fL (ref 78.0–100.0)
Monocytes Absolute: 0.6 10*3/uL (ref 0.1–1.0)
Monocytes Relative: 8.8 % (ref 3.0–12.0)
Neutro Abs: 4.4 10*3/uL (ref 1.4–7.7)
Neutrophils Relative %: 68.9 % (ref 43.0–77.0)
Platelets: 226 10*3/uL (ref 150.0–400.0)
RBC: 4.42 Mil/uL (ref 4.22–5.81)
RDW: 14.6 % (ref 11.5–15.5)
WBC: 6.4 10*3/uL (ref 4.0–10.5)

## 2023-03-07 LAB — HEMOGLOBIN A1C: Hgb A1c MFr Bld: 5.3 % (ref 4.6–6.5)

## 2023-03-07 LAB — VITAMIN B12: Vitamin B-12: 269 pg/mL (ref 211–911)

## 2023-03-07 NOTE — Progress Notes (Signed)
No critical labs need to be addressed urgently. We will discuss labs in detail at upcoming office visit.   

## 2023-03-10 ENCOUNTER — Encounter: Payer: Self-pay | Admitting: Family Medicine

## 2023-03-12 ENCOUNTER — Ambulatory Visit (INDEPENDENT_AMBULATORY_CARE_PROVIDER_SITE_OTHER): Payer: 59 | Admitting: Family Medicine

## 2023-03-12 VITALS — BP 110/62 | HR 100 | Temp 98.6°F | Ht 65.75 in | Wt 170.0 lb

## 2023-03-12 DIAGNOSIS — E8881 Metabolic syndrome: Secondary | ICD-10-CM

## 2023-03-12 DIAGNOSIS — K9589 Other complications of other bariatric procedure: Secondary | ICD-10-CM

## 2023-03-12 DIAGNOSIS — E119 Type 2 diabetes mellitus without complications: Secondary | ICD-10-CM

## 2023-03-12 DIAGNOSIS — Z7985 Long-term (current) use of injectable non-insulin antidiabetic drugs: Secondary | ICD-10-CM

## 2023-03-12 DIAGNOSIS — F3340 Major depressive disorder, recurrent, in remission, unspecified: Secondary | ICD-10-CM

## 2023-03-12 DIAGNOSIS — E538 Deficiency of other specified B group vitamins: Secondary | ICD-10-CM

## 2023-03-12 DIAGNOSIS — D508 Other iron deficiency anemias: Secondary | ICD-10-CM

## 2023-03-12 DIAGNOSIS — Z0000 Encounter for general adult medical examination without abnormal findings: Secondary | ICD-10-CM | POA: Diagnosis not present

## 2023-03-12 LAB — HM DIABETES FOOT EXAM

## 2023-03-12 MED ORDER — SERTRALINE HCL 100 MG PO TABS: ORAL_TABLET | ORAL | 0 refills | Status: DC

## 2023-03-12 MED ORDER — OZEMPIC (1 MG/DOSE) 4 MG/3ML ~~LOC~~ SOPN: 1.0000 mg | PEN_INJECTOR | SUBCUTANEOUS | 3 refills | Status: DC

## 2023-03-12 MED ORDER — ATORVASTATIN CALCIUM 10 MG PO TABS: 10.0000 mg | ORAL_TABLET | Freq: Every day | ORAL | 3 refills | Status: DC

## 2023-03-12 NOTE — Assessment & Plan Note (Signed)
At goal on supplementation.

## 2023-03-12 NOTE — Progress Notes (Signed)
Patient ID: William Garza, male    DOB: 11-06-1975, 47 y.o.   MRN: [id number]  This visit was conducted in person.  BP 110/62   Pulse 100   Temp 98.6 F (37 C) (Oral)   Ht 5' 5.75" (1.67 m)   Wt 170 lb (77.1 kg)   SpO2 98%   BMI 27.65 kg/m    CC:  Chief Complaint  Patient presents with   Annual Exam    Subjective:   HPI: William Garza is a 47 y.o. male presenting on 03/12/2023 for Annual Exam   The patient presents for annual medicare wellness, complete physical and review of chronic health problems. He/She also has the following acute concerns today:   MDD Well controlled on sertraline 150 mg daily Flowsheet Row Office Visit from 03/12/2023 in [facility]  PHQ-2 Total Score 0        Diabetes: Well-controlled on metformin 500 mg p.o. twice daily and semaglutide 1 mg weekly. Lab Results  Component Value Date   HGBA1C 5.3 03/07/2023  Using medications without difficulties: none Hypoglycemic episodes:none Hyperglycemic episodes:none Feet problems: no ulcers Blood Sugars averaging: not checking regularly eye exam within last year: Wt Readings from Last 3 Encounters:  03/12/23 170 lb (77.1 kg)  02/01/23 177 lb 6 oz (80.5 kg)  01/10/23 178 lb 2 oz (80.8 kg)  Body mass index is 27.65 kg/m.   LDL at goal on atorvastatin 10 mg daily  No SE. Lab Results  Component Value Date   CHOL 109 03/07/2023   HDL 47.40 03/07/2023   LDLCALC 51 03/07/2023   TRIG 50.0 03/07/2023   CHOLHDL 2 03/07/2023    Reviewed labs for  iron, anemia, cholesterol etc. S/P bariatric surgery. Wt Readings from Last 3 Encounters:  03/12/23 170 lb (77.1 kg)  02/01/23 177 lb 6 oz (80.5 kg)  01/10/23 178 lb 2 oz (80.8 kg)  Body mass index is 27.65 kg/m.      Relevant past medical, surgical, family and social history reviewed and updated as indicated. Interim medical history since our last visit reviewed. Allergies and medications reviewed and  updated. Outpatient Medications Prior to Visit  Medication Sig Dispense Refill   B-D DISP NEEDLE 25GX1" 25G X 1" MISC USE AS DIRECTED 3 each 3   cyanocobalamin (VITAMIN B12) 1000 MCG/ML injection INJECT INTRAMUSCULARLY EVERY 30 DAYS (DISCARD 28 DAYS AFTER  FIRST USE) 3 mL 0   metFORMIN (GLUCOPHAGE) 500 MG tablet TAKE 1 TABLET BY MOUTH TWICE  DAILY WITH MEALS 180 tablet 0   SYRINGE-NEEDLE, DISP, 3 ML (B-D 3CC LUER-LOK SYR 23GX1") 23G X 1" 3 ML MISC USE AS DIRECTED 3 each 0   atorvastatin (LIPITOR) 10 MG tablet TAKE 1 TABLET BY MOUTH DAILY 90 tablet 0   Semaglutide, 1 MG/DOSE, (OZEMPIC, 1 MG/DOSE,) 4 MG/3ML SOPN INJECT SUBCUTANEOUSLY 1 MG EVERY WEEK 9 mL 0   sertraline (ZOLOFT) 100 MG tablet TAKE 1 AND 1/2 TABLETS BY MOUTH  AT BEDTIME 135 tablet 0   No facility-administered medications prior to visit.     Per HPI unless specifically indicated in ROS section below Review of Systems  Constitutional:  Negative for fatigue and fever.  HENT:  Negative for ear pain.   Eyes:  Negative for pain.  Respiratory:  Negative for cough and shortness of breath.   Cardiovascular:  Negative for chest pain, palpitations and leg swelling.  Gastrointestinal:  Negative for abdominal pain.  Genitourinary:  Negative for dysuria.  Musculoskeletal:  Negative for arthralgias.  Neurological:  Negative for syncope, light-headedness and headaches.  Psychiatric/Behavioral:  Negative for dysphoric mood.    Objective:  BP 110/62   Pulse 100   Temp 98.6 F (37 C) (Oral)   Ht 5' 5.75" (1.67 m)   Wt 170 lb (77.1 kg)   SpO2 98%   BMI 27.65 kg/m   Wt Readings from Last 3 Encounters:  03/12/23 170 lb (77.1 kg)  02/01/23 177 lb 6 oz (80.5 kg)  01/10/23 178 lb 2 oz (80.8 kg)      Physical Exam Constitutional:      Appearance: He is well-developed.  HENT:     Head: Normocephalic.     Right Ear: Hearing normal.     Left Ear: Hearing normal.     Nose: Nose normal.  Neck:     Thyroid: No thyroid mass or  thyromegaly.     Vascular: No carotid bruit.     Trachea: Trachea normal.  Cardiovascular:     Rate and Rhythm: Normal rate and regular rhythm.     Pulses: Normal pulses.     Heart sounds: Heart sounds not distant. No murmur heard.    No friction rub. No gallop.     Comments: No peripheral edema Pulmonary:     Effort: Pulmonary effort is normal. No respiratory distress.     Breath sounds: Normal breath sounds.  Skin:    General: Skin is warm and dry.     Findings: No rash.  Psychiatric:        Speech: Speech normal.        Behavior: Behavior normal.        Thought Content: Thought content normal.     Diabetic foot exam: Normal inspection No skin breakdown No calluses  Normal DP pulses Normal sensation to light touch and monofilament Nails normal     Results for orders placed or performed in visit on 03/12/23  HM DIABETES FOOT EXAM  Result Value Ref Range   HM Diabetic Foot Exam done      COVID 19 screen:  No recent travel or known exposure to COVID19 The patient denies respiratory symptoms of COVID 19 at this time. The importance of social distancing was discussed today.   Assessment and Plan   The patient's preventative maintenance and recommended screening tests for an annual wellness exam were reviewed in full today. Brought up to date unless services declined.  Counselled on the importance of diet, exercise, and its role in overall health and mortality. The patient's FH and SH was reviewed, including their home life, tobacco status, and drug and alcohol status.    Vaccines: Plans flu shot at [facility], tetanus and COVID19 Prostate Cancer Screen: no early family history  Colon Cancer Screen:   Cologuard repeat in 3 years.      Smoking Status: none ETOH/ drug use:  beer 2-3 cans per week/none  Hep C:  done  HIV screen:   refused  Problem List Items Addressed This Visit     B12 deficiency     At goal on supplementation.        Depression, major,  recurrent (HCC)     Chronic, well controlled  Sertraline 150 mg daily       Relevant Medications   sertraline (ZOLOFT) 100 MG tablet   Diabetes mellitus type II, controlled, with no complications (HCC)     Chronic, excellent control.. no benefit from pushing A1C to  5.3. Continue semaglutide 1  mg weekly. Stop metformin.    Re-eval with A1C in 3 months.         Relevant Medications   atorvastatin (LIPITOR) 10 MG tablet   Semaglutide, 1 MG/DOSE, (OZEMPIC, 1 MG/DOSE,) 4 MG/3ML SOPN   Iron deficiency anemia following bariatric surgery    At goal on supplementation.      Metabolic syndrome    LDL at goal on atorvastatin 10 mg daily.      Other Visit Diagnoses     Routine general medical examination at a health care facility    -  Primary         Kerby Nora, MD

## 2023-03-12 NOTE — Assessment & Plan Note (Signed)
Chronic, excellent control.. no benefit from pushing A1C to  5.3. Continue semaglutide 1 mg weekly. Stop metformin.    Re-eval with A1C in 3 months.

## 2023-03-12 NOTE — Assessment & Plan Note (Signed)
LDL at goal on atorvastatin 10 mg daily.

## 2023-03-12 NOTE — Assessment & Plan Note (Signed)
Chronic, well controlled  Sertraline 150 mg daily

## 2023-05-02 ENCOUNTER — Other Ambulatory Visit: Payer: Self-pay | Admitting: Family Medicine

## 2023-07-14 ENCOUNTER — Encounter: Payer: Self-pay | Admitting: Family Medicine

## 2023-07-15 ENCOUNTER — Other Ambulatory Visit: Payer: Self-pay | Admitting: Family Medicine

## 2023-07-16 NOTE — Telephone Encounter (Signed)
Patient has been scheduled

## 2023-07-16 NOTE — Telephone Encounter (Signed)
Please call and schedule diabetes follow up with Dr. Ermalene Searing. She has wanted him to follow up in January.

## 2023-07-17 MED ORDER — SEMAGLUTIDE (2 MG/DOSE) 8 MG/3ML ~~LOC~~ SOPN: 2.0000 mg | PEN_INJECTOR | SUBCUTANEOUS | 3 refills | Status: DC

## 2023-07-17 NOTE — Telephone Encounter (Signed)
Wt Readings from Last 3 Encounters:  03/12/23 170 lb (77.1 kg)  02/01/23 177 lb 6 oz (80.5 kg)  01/10/23 178 lb 2 oz (80.8 kg)

## 2023-07-18 ENCOUNTER — Encounter: Payer: Self-pay | Admitting: Family Medicine

## 2023-07-19 ENCOUNTER — Encounter: Payer: Self-pay | Admitting: Family Medicine

## 2023-07-19 ENCOUNTER — Ambulatory Visit: Payer: 59 | Admitting: Family Medicine

## 2023-07-19 VITALS — BP 100/80 | HR 83 | Temp 98.3°F | Ht 65.75 in | Wt 180.4 lb

## 2023-07-19 DIAGNOSIS — E119 Type 2 diabetes mellitus without complications: Secondary | ICD-10-CM | POA: Diagnosis not present

## 2023-07-19 LAB — POCT GLYCOSYLATED HEMOGLOBIN (HGB A1C): Hemoglobin A1C: 5.3 % (ref 4.0–5.6)

## 2023-07-19 NOTE — Assessment & Plan Note (Signed)
Chronic, excellent control despite stopping metformin. Some weight gain over the winter months.  He will start increasing physical activity.  We will increase semaglutide to 2 mg weekly.   Re-eval with  POC A1C in 6 months.

## 2023-07-19 NOTE — Progress Notes (Signed)
Patient ID: Vardaan Depascale, male    DOB: [birth year], 48 y.o.   MRN: [id number]  This visit was conducted in person.  BP 100/80 (BP Location: Left Arm, Patient Position: Sitting, Cuff Size: Normal)   Pulse 83   Temp 98.3 F (36.8 C) (Temporal)   Ht 5' 5.75" (1.67 m)   Wt 180 lb 6 oz (81.8 kg)   SpO2 99%   BMI 29.34 kg/m    CC:  Chief Complaint  Patient presents with   Diabetes    Subjective:   HPI: William Garza is a 48 y.o. male presenting on 07/19/2023 for Diabetes  Diabetes: Well-controlled  semaglutide 2 mg weekly. ( Recent increase per MyChart request 2 days ago given plateaued weight loss, he has not started yet.)  NOW OFF METFROMIN Lab Results  Component Value Date   HGBA1C 5.3 07/19/2023  Using medications without difficulties: none Hypoglycemic episodes:none Hyperglycemic episodes:none Feet problems: no ulcers Blood Sugars averaging: not checking regularly eye exam within last year: Appt in 08/2023 Wt Readings from Last 3 Encounters:  07/19/23 180 lb 6 oz (81.8 kg)  03/12/23 170 lb (77.1 kg)  02/01/23 177 lb 6 oz (80.5 kg)  Body mass index is 29.34 kg/m.   LDL at goal on atorvastatin 10 mg daily   last check.No SE. Lab Results  Component Value Date   CHOL 109 03/07/2023   HDL 47.40 03/07/2023   LDLCALC 51 03/07/2023   TRIG 50.0 03/07/2023   CHOLHDL 2 03/07/2023   Hx of bariatric surgery..reviewed labs done at last physical.   Relevant past medical, surgical, family and social history reviewed and updated as indicated. Interim medical history since our last visit reviewed. Allergies and medications reviewed and updated. Outpatient Medications Prior to Visit  Medication Sig Dispense Refill   atorvastatin (LIPITOR) 10 MG tablet Take 1 tablet (10 mg total) by mouth daily. 90 tablet 3   B-D DISP NEEDLE 25GX1" 25G X 1" MISC USE AS DIRECTED 3 each 3   cyanocobalamin (VITAMIN B12) 1000 MCG/ML injection INJECT INTRAMUSCULARLY EVERY 30 DAYS (DISCARD  28 DAYS AFTER  FIRST USE) 3 mL 0   Semaglutide, 2 MG/DOSE, 8 MG/3ML SOPN Inject 2 mg as directed once a week. 9 mL 3   sertraline (ZOLOFT) 100 MG tablet TAKE 1 AND 1/2 TABLETS BY MOUTH  AT BEDTIME 135 tablet 1   SYRINGE-NEEDLE, DISP, 3 ML (B-D 3CC LUER-LOK SYR 23GX1") 23G X 1" 3 ML MISC USE AS DIRECTED 3 each 0   metFORMIN (GLUCOPHAGE) 500 MG tablet TAKE 1 TABLET BY MOUTH TWICE  DAILY WITH MEALS 180 tablet 0   No facility-administered medications prior to visit.     Per HPI unless specifically indicated in ROS section below Review of Systems  Constitutional:  Negative for fatigue and fever.  HENT:  Negative for ear pain.   Eyes:  Negative for pain.  Respiratory:  Negative for cough and shortness of breath.   Cardiovascular:  Negative for chest pain, palpitations and leg swelling.  Gastrointestinal:  Negative for abdominal pain.  Genitourinary:  Negative for dysuria.  Musculoskeletal:  Negative for arthralgias.  Neurological:  Negative for syncope, light-headedness and headaches.  Psychiatric/Behavioral:  Negative for dysphoric mood.    Objective:  BP 100/80 (BP Location: Left Arm, Patient Position: Sitting, Cuff Size: Normal)   Pulse 83   Temp 98.3 F (36.8 C) (Temporal)   Ht 5' 5.75" (1.67 m)   Wt 180 lb 6 oz (81.8 kg)  SpO2 99%   BMI 29.34 kg/m   Wt Readings from Last 3 Encounters:  07/19/23 180 lb 6 oz (81.8 kg)  03/12/23 170 lb (77.1 kg)  02/01/23 177 lb 6 oz (80.5 kg)      Physical Exam Constitutional:      Appearance: He is well-developed.  HENT:     Head: Normocephalic.     Right Ear: Hearing normal.     Left Ear: Hearing normal.     Nose: Nose normal.  Neck:     Thyroid: No thyroid mass or thyromegaly.     Vascular: No carotid bruit.     Trachea: Trachea normal.  Cardiovascular:     Rate and Rhythm: Normal rate and regular rhythm.     Pulses: Normal pulses.     Heart sounds: Heart sounds not distant. No murmur heard.    No friction rub. No gallop.      Comments: No peripheral edema Pulmonary:     Effort: Pulmonary effort is normal. No respiratory distress.     Breath sounds: Normal breath sounds.  Skin:    General: Skin is warm and dry.     Findings: No rash.  Psychiatric:        Speech: Speech normal.        Behavior: Behavior normal.        Thought Content: Thought content normal.     Diabetic foot exam: Normal inspection No skin breakdown No calluses  Normal DP pulses Normal sensation to light touch and monofilament Nails normal     Results for orders placed or performed in visit on 07/19/23  POCT glycosylated hemoglobin (Hb A1C)   Collection Time: 07/19/23  9:30 AM  Result Value Ref Range   Hemoglobin A1C 5.3 4.0 - 5.6 %   HbA1c POC (<> result, manual entry)     HbA1c, POC (prediabetic range)     HbA1c, POC (controlled diabetic range)       COVID 19 screen:  No recent travel or known exposure to COVID19 The patient denies respiratory symptoms of COVID 19 at this time. The importance of social distancing was discussed today.   Assessment and Plan  Pt will consider shingrix and will get [place] records of his last TDap at work.  Problem List Items Addressed This Visit     Diabetes mellitus type II, controlled, with no complications (HCC) - Primary    Chronic, excellent control despite stopping metformin. Some weight gain over the winter months.  He will start increasing physical activity.  We will increase semaglutide to 2 mg weekly.   Re-eval with  POC A1C in 6 months.         Relevant Orders   POCT glycosylated hemoglobin (Hb A1C) (Completed)      Kerby Nora, MD

## 2023-12-11 ENCOUNTER — Other Ambulatory Visit: Payer: Self-pay | Admitting: Family Medicine

## 2024-02-09 ENCOUNTER — Other Ambulatory Visit: Payer: Self-pay | Admitting: Family Medicine

## 2024-02-14 LAB — HM DIABETES EYE EXAM

## 2024-02-16 ENCOUNTER — Other Ambulatory Visit: Payer: Self-pay | Admitting: Family Medicine

## 2024-02-17 NOTE — Telephone Encounter (Signed)
Called pt wasn't able to leave message

## 2024-02-17 NOTE — Telephone Encounter (Signed)
 Pt due for DM fu. Please schedule.

## 2024-02-26 NOTE — Telephone Encounter (Signed)
 Unable to leave vm.

## 2024-03-10 ENCOUNTER — Ambulatory Visit: Admitting: Family Medicine

## 2024-03-10 ENCOUNTER — Encounter: Payer: Self-pay | Admitting: Family Medicine

## 2024-03-10 ENCOUNTER — Ambulatory Visit: Payer: Self-pay | Admitting: Family Medicine

## 2024-03-10 VITALS — BP 92/60 | HR 86 | Temp 97.7°F | Ht 65.75 in | Wt 163.1 lb

## 2024-03-10 DIAGNOSIS — Z7985 Long-term (current) use of injectable non-insulin antidiabetic drugs: Secondary | ICD-10-CM

## 2024-03-10 DIAGNOSIS — E119 Type 2 diabetes mellitus without complications: Secondary | ICD-10-CM

## 2024-03-10 DIAGNOSIS — Z9884 Bariatric surgery status: Secondary | ICD-10-CM | POA: Diagnosis not present

## 2024-03-10 LAB — MICROALBUMIN / CREATININE URINE RATIO
Creatinine,U: 201.6 mg/dL
Microalb Creat Ratio: 10.6 mg/g (ref 0.0–30.0)
Microalb, Ur: 2.1 mg/dL — ABNORMAL HIGH (ref 0.0–1.9)

## 2024-03-10 LAB — POCT GLYCOSYLATED HEMOGLOBIN (HGB A1C): Hemoglobin A1C: 5.1 % (ref 4.0–5.6)

## 2024-03-10 NOTE — Assessment & Plan Note (Addendum)
 Chronic, excellent control despite stopping metformin .  Continue low carb heart healthy diet and regular physical activity.    semaglutide  to 2 mg weekly.   Re-eval with   in 6 months.

## 2024-03-10 NOTE — Progress Notes (Signed)
 Patient ID: William Garza, male    DOB: 22-Jun-1975, 48 y.o.   MRN: [id number]  This visit was conducted in person.  BP 92/60   Pulse 86   Temp 97.7 F (36.5 C) (Temporal)   Ht 5' 5.75 (1.67 m)   Wt 163 lb 2 oz (74 kg)   SpO2 99%   BMI 26.53 kg/m    CC:  Chief Complaint  Patient presents with   Diabetes    Subjective:   HPI: Valiant Dills is a 48 y.o. male presenting on 03/10/2024 for Diabetes  Diabetes: Well-controlled  semaglutide  2 mg weekly.  NOW OFF METFROMIN Lab Results  Component Value Date   HGBA1C 5.1 03/10/2024  Using medications without difficulties: none Hypoglycemic episodes:none Hyperglycemic episodes:none Feet problems: no ulcers Blood Sugars averaging: not checking regularly eye exam within last year: Appt in 08/2023 Wt Readings from Last 3 Encounters:  03/10/24 163 lb 2 oz (74 kg)  07/19/23 180 lb 6 oz (81.8 kg)  03/12/23 170 lb (77.1 kg)  Body mass index is 26.53 kg/m.  Hx of bariatric surgery  Relevant past medical, surgical, family and social history reviewed and updated as indicated. Interim medical history since our last visit reviewed. Allergies and medications reviewed and updated. Outpatient Medications Prior to Visit  Medication Sig Dispense Refill   atorvastatin  (LIPITOR) 10 MG tablet TAKE 1 TABLET BY MOUTH DAILY 30 tablet 0   B-D DISP NEEDLE 25GX1 25G X 1 MISC USE AS DIRECTED 3 each 3   cyanocobalamin  (VITAMIN B12) 1000 MCG/ML injection INJECT INTRAMUSCULARLY 1ML EVERY 30 DAYS (DISCARD 28 DAYS AFTER  FIRST USE) 3 mL 0   Semaglutide , 2 MG/DOSE, 8 MG/3ML SOPN Inject 2 mg as directed once a week. 9 mL 3   sertraline  (ZOLOFT ) 100 MG tablet TAKE 1 AND 1/2 TABLETS BY MOUTH  AT BEDTIME 45 tablet 0   SYRINGE-NEEDLE, DISP, 3 ML (B-D 3CC LUER-LOK SYR 23GX1) 23G X 1 3 ML MISC USE AS DIRECTED 3 each 0   No facility-administered medications prior to visit.     Per HPI unless specifically indicated in ROS section below Review of  Systems  Constitutional:  Negative for fatigue and fever.  HENT:  Negative for ear pain.   Eyes:  Negative for pain.  Respiratory:  Negative for cough and shortness of breath.   Cardiovascular:  Negative for chest pain, palpitations and leg swelling.  Gastrointestinal:  Negative for abdominal pain.  Genitourinary:  Negative for dysuria.  Musculoskeletal:  Negative for arthralgias.  Neurological:  Negative for syncope, light-headedness and headaches.  Psychiatric/Behavioral:  Negative for dysphoric mood.    Objective:  BP 92/60   Pulse 86   Temp 97.7 F (36.5 C) (Temporal)   Ht 5' 5.75 (1.67 m)   Wt 163 lb 2 oz (74 kg)   SpO2 99%   BMI 26.53 kg/m   Wt Readings from Last 3 Encounters:  03/10/24 163 lb 2 oz (74 kg)  07/19/23 180 lb 6 oz (81.8 kg)  03/12/23 170 lb (77.1 kg)      Physical Exam Constitutional:      Appearance: He is well-developed.  HENT:     Head: Normocephalic.     Right Ear: Hearing normal.     Left Ear: Hearing normal.     Nose: Nose normal.  Neck:     Thyroid: No thyroid mass or thyromegaly.     Vascular: No carotid bruit.     Trachea: Trachea normal.  Cardiovascular:  Rate and Rhythm: Normal rate and regular rhythm.     Pulses: Normal pulses.     Heart sounds: Heart sounds not distant. No murmur heard.    No friction rub. No gallop.     Comments: No peripheral edema Pulmonary:     Effort: Pulmonary effort is normal. No respiratory distress.     Breath sounds: Normal breath sounds.  Skin:    General: Skin is warm and dry.     Findings: No rash.  Psychiatric:        Speech: Speech normal.        Behavior: Behavior normal.        Thought Content: Thought content normal.         Results for orders placed or performed in visit on 03/10/24  POCT glycosylated hemoglobin (Hb A1C)   Collection Time: 03/10/24  8:39 AM  Result Value Ref Range   Hemoglobin A1C 5.1 4.0 - 5.6 %   HbA1c POC (<> result, manual entry)     HbA1c, POC (prediabetic  range)     HbA1c, POC (controlled diabetic range)       COVID 19 screen:  No recent travel or known exposure to COVID19 The patient denies respiratory symptoms of COVID 19 at this time. The importance of social distancing was discussed today.   Assessment and Plan  Pt will consider shingrix and will get us  records of his last TDap at work.  Problem List Items Addressed This Visit     Diabetes mellitus type II, controlled, with no complications (HCC)    Chronic, excellent control despite stopping metformin .  Continue low carb heart healthy diet and regular physical activity.    semaglutide  to 2 mg weekly.   Re-eval with   in 6 months.         S/P bariatric surgery   Other Visit Diagnoses       Controlled type 2 diabetes mellitus without complication, without long-term current use of insulin (HCC)    -  Primary   Relevant Orders   POCT glycosylated hemoglobin (Hb A1C) (Completed)   Microalbumin / creatinine urine ratio     Long-term current use of injectable noninsulin antidiabetic medication             Greig Ring, MD

## 2024-03-31 ENCOUNTER — Other Ambulatory Visit: Payer: Self-pay | Admitting: Family Medicine

## 2024-04-01 ENCOUNTER — Encounter: Admitting: Family Medicine

## 2024-05-04 ENCOUNTER — Other Ambulatory Visit: Payer: Self-pay | Admitting: Family Medicine

## 2024-06-05 ENCOUNTER — Other Ambulatory Visit: Payer: Self-pay | Admitting: Family Medicine

## 2024-06-08 ENCOUNTER — Other Ambulatory Visit: Payer: Self-pay | Admitting: Family Medicine

## 2024-09-09 ENCOUNTER — Other Ambulatory Visit

## 2024-09-16 ENCOUNTER — Encounter: Admitting: Family Medicine
